# Patient Record
Sex: Female | Born: 1985 | Race: Black or African American | Hispanic: No | Marital: Married | Smoking: Former smoker
Health system: Southern US, Community
[De-identification: ages and names within clinical notes are randomized; demographics above are authoritative.]

## PROBLEM LIST (undated history)

## (undated) DIAGNOSIS — D649 Anemia, unspecified: Secondary | ICD-10-CM

## (undated) HISTORY — PX: NO PAST SURGERIES: SHX2092

## (undated) HISTORY — DX: Anemia, unspecified: D64.9

---
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
## 2006-07-15 DIAGNOSIS — J309 Allergic rhinitis, unspecified: Secondary | ICD-10-CM | POA: Insufficient documentation

## 2007-03-17 ENCOUNTER — Ambulatory Visit: Payer: Self-pay | Admitting: Family Medicine

## 2007-03-17 ENCOUNTER — Encounter: Payer: Self-pay | Admitting: Family Medicine

## 2007-03-17 ENCOUNTER — Encounter (INDEPENDENT_AMBULATORY_CARE_PROVIDER_SITE_OTHER): Payer: Self-pay | Admitting: Family Medicine

## 2007-03-17 DIAGNOSIS — R5381 Other malaise: Secondary | ICD-10-CM | POA: Insufficient documentation

## 2007-03-17 DIAGNOSIS — L738 Other specified follicular disorders: Secondary | ICD-10-CM | POA: Insufficient documentation

## 2007-03-17 DIAGNOSIS — L83 Acanthosis nigricans: Secondary | ICD-10-CM | POA: Insufficient documentation

## 2007-03-17 DIAGNOSIS — R5383 Other fatigue: Secondary | ICD-10-CM

## 2007-03-17 LAB — CONVERTED CEMR LAB
ALT: 12 units/L (ref 0–35)
AST: 18 units/L (ref 0–37)
Albumin: 4.2 g/dL (ref 3.5–5.2)
Alkaline Phosphatase: 98 units/L (ref 39–117)
BUN: 10 mg/dL (ref 6–23)
CO2: 24 meq/L (ref 19–32)
Calcium: 9.6 mg/dL (ref 8.4–10.5)
Chloride: 101 meq/L (ref 96–112)
Creatinine, Ser: 0.73 mg/dL (ref 0.40–1.20)
Glucose, Bld: 74 mg/dL (ref 70–99)
HCT: 35.4 % — ABNORMAL LOW (ref 36.0–46.0)
Hemoglobin: 12.4 g/dL (ref 12.0–15.0)
MCHC: 35 g/dL (ref 30.0–36.0)
MCV: 79.2 fL (ref 78.0–100.0)
Platelets: 290 10*3/uL (ref 150–400)
Potassium: 4.1 meq/L (ref 3.5–5.3)
RBC: 4.47 M/uL (ref 3.87–5.11)
RDW: 15.2 % — ABNORMAL HIGH (ref 11.5–14.0)
Sodium: 139 meq/L (ref 135–145)
TSH: 1.399 microintl units/mL (ref 0.350–5.50)
Total Bilirubin: 0.5 mg/dL (ref 0.3–1.2)
Total Protein: 7.9 g/dL (ref 6.0–8.3)
WBC: 7 10*3/uL (ref 4.0–10.5)

## 2007-07-20 ENCOUNTER — Telehealth: Payer: Self-pay | Admitting: *Deleted

## 2007-07-20 ENCOUNTER — Ambulatory Visit: Payer: Self-pay | Admitting: Family Medicine

## 2007-07-20 LAB — CONVERTED CEMR LAB: Beta hcg, urine, semiquantitative: NEGATIVE

## 2007-08-03 ENCOUNTER — Ambulatory Visit: Payer: Self-pay | Admitting: Family Medicine

## 2007-08-03 LAB — CONVERTED CEMR LAB: Beta hcg, urine, semiquantitative: NEGATIVE

## 2007-10-21 ENCOUNTER — Ambulatory Visit: Payer: Self-pay | Admitting: Family Medicine

## 2007-10-26 ENCOUNTER — Telehealth: Payer: Self-pay | Admitting: *Deleted

## 2007-11-07 ENCOUNTER — Telehealth: Payer: Self-pay | Admitting: *Deleted

## 2010-06-17 NOTE — Assessment & Plan Note (Signed)
Summary: ear pain, loss of hearing & swollen   Vital Signs:  Patient Profile:   25 Years Old Female Weight:      217 pounds Temp:     98.8 degrees F BP sitting:   119 / 70  Vitals Entered By: Jone Baseman CMA (July 20, 2007 11:43 AM)                 Chief Complaint:  left ear pain.  History of Present Illness: ear pain 4-5 days--maybe a little drainage from it. no fevers, no nasal congestion or cough  wants to start on depo provera--lmp was 3 weeks ago        Physical Exam  Ears:     left ear canal boggy red and some exudate. B TMs are normal in appearance.small posterior shotty nodes on left Neck:     supple, full ROM, and no masses.      Impression & Recommendations:  Problem # 1:  OTITIS EXTERNA, ACUTE, LEFT (ICD-380.22) Assessment: New  Orders: [facility]- Est Level  3 (04540)   Problem # 2:  CONTRACEPTIVE MANAGEMENT (ICD-V25.09) Assessment: New  Orders: U Preg-[facility] (81025) [facility]- Est Level  3 (99213) since 3 1/2 weeks lmp will check preg test today and have her rtc 2 weeks for repeat u preg and begin depo  Complete Medication List: 1)  Cortisporin 3.5-10000-1 Soln (Neomycin-polymyxin-hc) .... 2 gtt left ear qid for 1 week 2)  Ibuprofen 600 Mg Tabs (Ibuprofen) .Marland Kitchen.. 1 by mouth q 6 hrs as needed ear pain   Patient Instructions: 1)  Please schedule a follow-up appointment in 2 weeks. will repeat pregnancy test then and if negative start depo provera shots    Prescriptions: IBUPROFEN 600 MG  TABS (IBUPROFEN) 1 by mouth q 6 hrs as needed ear pain  #60 x 1   Entered and Authorized by:   Denny Levy MD   Signed by:   Denny Levy MD on 07/20/2007   Method used:   Print then Give to Patient   RxID:   [id number] CORTISPORIN 3.5-10000-1  SOLN (NEOMYCIN-POLYMYXIN-HC) 2 gtt left ear qid for 1 week  #1 bottle x 1   Entered and Authorized by:   Denny Levy MD   Signed by:   Denny Levy MD on 07/20/2007   Method used:   Print then Give to Patient   RxID:    [email or web address]  ] Laboratory Results   Urine Tests  Date/Time Received: July 20, 2007 12:08  PM  Date/Time Reported: July 20, 2007 12:13 PM     Urine HCG: negative Comments: ...............test performed by......Marland KitchenBonnie A. [place], [place] (ASCP)

## 2010-06-17 NOTE — Assessment & Plan Note (Signed)
Summary: UPREG/DEPO/OVERSTREET/[facility]  Nurse Visit   Complete Medication List: 1)  Cortisporin 3.5-10000-1 Soln (Neomycin-polymyxin-hc) .... 2 gtt left ear qid for 1 week 2)  Ibuprofen 600 Mg Tabs (Ibuprofen) .Marland Kitchen.. 1 by mouth q 6 hrs as needed ear pain    Prior Medications: CORTISPORIN 3.5-10000-1  SOLN (NEOMYCIN-POLYMYXIN-HC) 2 gtt left ear qid for 1 week IBUPROFEN 600 MG  TABS (IBUPROFEN) 1 by mouth q 6 hrs as needed ear pain  Laboratory Results   Urine Tests  Date/Time Received: August 03, 2007 2:01 PM  Date/Time Reported: August 03, 2007 2:06 PM     Urine HCG: negative Comments: ...................................................................DON[facility]  August 03, 2007 2:06 PM       Medication Administration  Injection # 1:    Medication: Depo-Provera 150mg     Diagnosis: CONTRACEPTIVE MANAGEMENT (ICD-V25.09)    Route: IM    Site: L deltoid    Exp Date: 11/2009    Lot #: [id number]    Mfr: pfizer    Comments: Pt instructed to return to clinic for next depo provera 10/19/07 - 11/02/07.    Patient tolerated injection without complications    Given by: AMY MARTIN RN (August 03, 2007 2:19 PM)  Orders Added: 1)  U Preg-[facility] [81025] 2)  Est Level 1- [facility] [06301] 3)  Depo-Provera 150mg  [J1055]    ]   Medication Administration  Injection # 1:    Medication: Depo-Provera 150mg     Diagnosis: CONTRACEPTIVE MANAGEMENT (ICD-V25.09)    Route: IM    Site: L deltoid    Exp Date: 11/2009    Lot #: [id number]    Mfr: pfizer    Comments: Pt instructed to return to clinic for next depo provera 10/19/07 - 11/02/07.    Patient tolerated injection without complications    Given by: AMY MARTIN RN (August 03, 2007 2:19 PM)  Orders Added: 1)  U Preg-[facility] [81025] 2)  Est Level 1- [facility] [23557] 3)  Depo-Provera 150mg  [J1055]

## 2010-06-17 NOTE — Progress Notes (Signed)
Summary: Depo  Phone Note Call from Patient Call back at [facility] Phone [phone]   Summary of Call: Is requesting to discuss depo. Initial call taken by: Haydee Salter,  November 07, 2007 10:10 AM  Follow-up for Phone Call        c/o light bleeding every day. this is her 2nd depo shot. explained that some women take 2-3 shots before menses even out or disappear. she was ok with answer Follow-up by: Golden Circle RN,  November 07, 2007 2:28 PM

## 2010-06-17 NOTE — Assessment & Plan Note (Signed)
Summary: np/cpp/pap/mom Kelly Ware is pt of Kelly Ware/ el   Vital Signs:  Patient Profile:   25 Years Old Female Weight:      212 pounds Pulse rate:   82 / minute BP sitting:   118 / 81  Vitals Entered By: Lillia Pauls CMA (March 17, 2007 2:16 PM)                 Chief Complaint:  NP FOR CPP.  History of Present Illness: Pt is a 25YOF here for her annual physical with pap.  She has 3 concerns:  1.  PNA--had "walking" PNA 1 month ago, diagnosed at an outside clinic when she was out of town.  REceived a course of antibiotics and improved.  She wants to make sure that her lungs sound OK.  2.  2 "bumps" on her groin--first noticed them several months ago.  They come and go, but often occur in conjunction with her period.  THey are itchy, but not painful.  They usually last about a week before going away.  No other rash.  Not sexually active.  Does shave her groin.    3.  fatigue--She feels "tired a lot"  SHe was diagnosed with anemia in 2004 was given a 22-month prescription of iron tablets and felt better when she took them.  Has felt tired since she ran out.  Was also told she was anemic when she was diagnosed with PNA.     Past Medical History:    told she was anemic in the past   Family History:    DM--grandmother  Social History:     Pt. Plays basketball    Pt going back to school in Jan/08    Works at [employer] demanding job    Smokes "blacks"--about 3 per week    drinks ocassionally--about 1 X month    no illicit drugs    no sexual activity    single    lives with mom and younger sister    Review of Systems  General      Complains of fatigue.      Denies weight loss.  Resp      Denies cough and sputum productive.  GU      Denies abnormal vaginal bleeding, hematuria, urinary frequency, and urinary hesitancy.      see hpi;  does have heavy periods   Physical Exam  General:     Well-developed,well-nourished,in no acute distress;  alert,appropriate and cooperative throughout examination Head:     Normocephalic and atraumatic without obvious abnormalities. No apparent alopecia or balding. Eyes:     No corneal or conjunctival inflammation noted. EOMI. Perrla.  Vision grossly normal. Mouth:     Oral mucosa and oropharynx without lesions or exudates.  Teeth in good repair. Neck:     No deformities, masses, or tenderness noted. Breasts:     No mass, nodules, thickening, tenderness, bulging, retraction, inflamation, nipple discharge or skin changes noted.   Lungs:     Normal respiratory effort, chest expands symmetrically. Lungs are clear to auscultation, no crackles or wheezes. Heart:     Normal rate and regular rhythm. S1 and S2 normal without gallop, murmur, click, rub or other extra sounds. Abdomen:     Bowel sounds positive,abdomen soft and non-tender without masses, organomegaly or hernias noted. Genitalia:     2 visible areas of folliculitis and one small palpable lump on labia majora.  Normal introitus for age, no vaginal discharge, mucosa pink and moist, no  vaginal or cervical lesions, no vaginal atrophy, no friaility or hemorrhage, normal uterus size positioned slightly laterally, no adnexal masses or tenderness Neurologic:     No cranial nerve deficits noted. Station and gait are normal.  Cervical Nodes:     No lymphadenopathy noted Axillary Nodes:     No palpable lymphadenopathy Psych:     Cognition and judgment appear intact. Alert and cooperative with normal attention span and concentration. No apparent delusions, illusions, hallucinations    Impression & Recommendations:  Problem # 1:  FATIGUE (ICD-780.79) Assessment: New History of anemia.  No hematuria, melena, or BRBPR.   Will check labs as below.  Told pt I would call her if any abnormalities were found.  Orders: CBC-[facility] (16109) TSH-[facility] [phone]) Comp Met-[facility] [phone]) Sed Rate (ESR)-[facility] [phone]) [facility]- Est Level  3  (57846)   Problem # 2:  ACANTHOSIS NIGRICANS (ICD-701.2) Assessment: New Found on exam.  Will check a BMET.  Does have family hx of DM (grandmother).  Pt does say she has some polyuria and fatigue, but no polydipsia or wt loss.  Has never been told that she has a high BG in the past. Orders: [facility]- Est Level  3 (96295)   Problem # 3:  Preventive Health Care (ICD-V70.0) Assessment: Unchanged  Pap smear done.  Counseled pt about gardisil vaccine.  She would like to receive it.  Would also like flu vaccine.   Problem # 4:  FOLLICULITIS (ICD-704.8) Assessment: New Will probably resolve on its own.  Told pt she could use OTC neosporin.  Also not shaving in that area may help.  Other Orders: Pap Smear- [facility] (Pap) HPV Vaccine - 3 sched doses - IM (28413) Admin 1st Vaccine (24401) Flu Vaccine & Administration [phone]) Flu Vaccine & Administration [phone])   Patient Instructions: 1)  Please schedule a follow-up appointment in 1 year. 2)  If any of your labs are abnormal, we will call you.    ]  HPV # 1    Vaccine Type: Gardasil    Site: right deltoid    Mfr: Merck    Dose: 0.5 ml    Route: IM    Given by: Lillia Pauls CMA    Exp. Date: 11/19/2008    Lot #: [id number]    VIS given: 06/19/05 version given March 17, 2007.  Flu Vaccine Consent Questions     Do you have a history of severe allergic reactions to this vaccine? no    Any prior history of allergic reactions to egg and/or gelatin? no    Do you have a sensitivity to the preservative Thimersol? no    Do you have a past history of Guillan-Barre Syndrome? no    Do you currently have an acute febrile illness? no    Have you ever had a severe reaction to latex? no    Vaccine information given and explained to patient? yes    Are you currently pregnant? no  Lot Number:[id number] Site Given l Deltoid  Laboratory Results   Blood Tests   Date/Time Received: March 17, 2007 3.51  PM  Date/Time Reported: March 17, 2007 5:16  PM   SED rate: 25 mm/hr  Comments: ...............test performed by......Marland KitchenBonnie A. [place], [place] (ASCP)

## 2010-06-17 NOTE — Assessment & Plan Note (Signed)
Summary: DEPO/KH  Nurse Visit    Prior Medications: CORTISPORIN 3.5-10000-1  SOLN (NEOMYCIN-POLYMYXIN-HC) 2 gtt left ear qid for 1 week IBUPROFEN 600 MG  TABS (IBUPROFEN) 1 by mouth q 6 hrs as needed ear pain     Medication Administration  Injection # 1:    Medication: Depo-Provera 150mg     Diagnosis: CONTRACEPTIVE MANAGEMENT (ICD-V25.09)    Route: IM    Site: L deltoid    Exp Date: 01/16/2010    Lot #: [id number]    Mfr: Pfizer    Comments: Next Depo Due: August 21 - September 4    Patient tolerated injection without complications    Given by: Garen Grams LPN (October 20, 452 12:08 PM)  Orders Added: 1)  Depo-Provera 150mg  [J1055] 2)  Est Level 1- [facility] [09811]    ]

## 2010-06-17 NOTE — Progress Notes (Signed)
Summary: wi request  Phone Note Call from Patient Call back at Home Phone [phone]   Reason for Call: Talk to Nurse Summary of Call: pt is requesting wi appt, sts she has pain in her ear and cold symptoms. Initial call taken by: ERIN LEVAN,  July 20, 2007 9:34 AM  Follow-up for Phone Call        had a cold. woke up with ear painful, swollen & can't hear. work in at [employer]. wait time explained Follow-up by: Golden Circle RN,  July 20, 2007 9:35 AM

## 2011-01-21 ENCOUNTER — Emergency Department (HOSPITAL_COMMUNITY)
Admission: EM | Admit: 2011-01-21 | Discharge: 2011-01-21 | Disposition: A | Discharge status: Home / Self Care | Payer: Self-pay | Attending: Emergency Medicine | Admitting: Emergency Medicine

## 2011-01-21 ENCOUNTER — Emergency Department (HOSPITAL_COMMUNITY): Payer: Self-pay

## 2011-01-21 DIAGNOSIS — R059 Cough, unspecified: Secondary | ICD-10-CM | POA: Insufficient documentation

## 2011-01-21 DIAGNOSIS — Z8701 Personal history of pneumonia (recurrent): Secondary | ICD-10-CM | POA: Insufficient documentation

## 2011-01-21 DIAGNOSIS — R509 Fever, unspecified: Secondary | ICD-10-CM | POA: Insufficient documentation

## 2011-01-21 DIAGNOSIS — R05 Cough: Secondary | ICD-10-CM | POA: Insufficient documentation

## 2011-01-21 DIAGNOSIS — R0602 Shortness of breath: Secondary | ICD-10-CM | POA: Insufficient documentation

## 2011-01-21 DIAGNOSIS — R079 Chest pain, unspecified: Secondary | ICD-10-CM | POA: Insufficient documentation

## 2011-01-21 DIAGNOSIS — F17200 Nicotine dependence, unspecified, uncomplicated: Secondary | ICD-10-CM | POA: Insufficient documentation

## 2011-01-21 DIAGNOSIS — B9789 Other viral agents as the cause of diseases classified elsewhere: Secondary | ICD-10-CM | POA: Insufficient documentation

## 2011-03-12 ENCOUNTER — Encounter: Payer: Self-pay | Admitting: Family Medicine

## 2011-03-12 ENCOUNTER — Ambulatory Visit (INDEPENDENT_AMBULATORY_CARE_PROVIDER_SITE_OTHER): Payer: BC Managed Care – PPO | Admitting: Family Medicine

## 2011-03-12 DIAGNOSIS — Z23 Encounter for immunization: Secondary | ICD-10-CM

## 2011-03-12 DIAGNOSIS — S39012A Strain of muscle, fascia and tendon of lower back, initial encounter: Secondary | ICD-10-CM | POA: Insufficient documentation

## 2011-03-12 DIAGNOSIS — S335XXA Sprain of ligaments of lumbar spine, initial encounter: Secondary | ICD-10-CM

## 2011-03-12 MED ORDER — MELOXICAM 15 MG PO TABS: 15.0000 mg | ORAL_TABLET | Freq: Every day | ORAL | Status: AC

## 2011-03-12 NOTE — Patient Instructions (Addendum)
Meloxicam- medicine for back pain  Also use tylenol with it but no other NSAIDS (ibuprofen, aleve)  See back exercises to help keep you strong and prevent you from pulling it.  Can consider physical therapy if you don't seem to be making progress.  Make follow-up for you annual physical  Back Pain, Adult Low back pain is very common. About 1 in 5 people have back pain. The cause of low back pain is rarely dangerous. The pain often gets better over time. About half of people with a sudden onset of back pain feel better in just 2 weeks. About 8 in 10 people feel better by 6 weeks.   CAUSES Some common causes of back pain include:  Strain of the muscles or ligaments supporting the spine.     Wear and tear (degeneration) of the spinal discs.     Arthritis.    Direct injury to the back.  DIAGNOSIS Most of the time, the direct cause of low back pain is not known. However, back pain can be treated effectively even when the exact cause of the pain is unknown. Answering your caregiver's questions about your overall health and symptoms is one of the most accurate ways to make sure the cause of your pain is not dangerous. If your caregiver needs more information, he or she may order lab work or imaging tests (X-rays or MRIs). However, even if imaging tests show changes in your back, this usually does not require surgery. HOME CARE INSTRUCTIONS For many people, back pain returns. Since low back pain is rarely dangerous, it is often a condition that people can learn to manage on their own.    Remain active. It is stressful on the back to sit or stand in one place. Do not sit, drive, or stand in one place for more than 30 minutes at a time. Take short walks on level surfaces as soon as pain allows. Try to increase the length of time you walk each day.     Do not stay in bed. Resting more than 1 or 2 days can delay your recovery.     Do not avoid exercise or work. Your body is made to move. It is not  dangerous to be active, even though your back may hurt. Your back will likely heal faster if you return to being active before your pain is gone.     Pay attention to your body when you  bend and lift. Many people have less discomfort when lifting if they bend their knees, keep the load close to their bodies, and avoid twisting. Often, the most comfortable positions are those that put less stress on your recovering back.     Find a comfortable position to sleep. Use a firm mattress and lie on your side with your knees slightly bent. If you lie on your back, put a pillow under your knees.     Only take over-the-counter or prescription medicines as directed by your caregiver. Over-the-counter medicines to reduce pain and inflammation are often the most helpful. Your caregiver may prescribe muscle relaxant drugs. These medicines help dull your pain so you can more quickly return to your normal activities and healthy exercise.     Put ice on the injured area.     Put ice in a plastic bag.     Place a towel between your skin and the bag.     Leave the ice on for 15 to 20 minutes, 3 to 4 times a  day for the first 2 to 3 days. After that, ice and heat may be alternated to reduce pain and spasms.     Ask your caregiver about trying back exercises and gentle massage. This may be of some benefit.     Avoid feeling anxious or stressed. Stress increases muscle tension and can worsen back pain. It is important to recognize when you are anxious or stressed and learn ways to manage it. Exercise is a great option.  SEEK MEDICAL CARE IF:  You have pain that is not relieved with rest or medicine.     You have pain that does not improve in 1 week.     You have new symptoms.     You are generally not feeling well.  SEEK IMMEDIATE MEDICAL CARE IF:    You have pain that radiates from your back into your legs.     You develop new bowel or bladder control problems.     You have unusual weakness or numbness  in your arms or legs.     You develop nausea or vomiting.     You develop abdominal pain.     You feel faint.  Document Released: 05/04/2005 Document Revised: 01/14/2011 Document Reviewed: 09/22/2010 [facility] Patient Information 2012 [place], [place].

## 2011-03-12 NOTE — Progress Notes (Signed)
  Subjective:    Patient ID: Kelly Ware, female    DOB: 1986/01/05, 25 y.o.   MRN: [id number]  HPIBack pain:  2-3 months of low back pain since she had a sharp pain when bending over.  Resolved and for the past 2 weeks has been hurting again.  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  Started as intermittent pain.  Takes ibuprofen 2 tabs "every so often"  States pain is not every day.  No radiation.    Stiff when waking up, hurts more after a long day.  Plays basketball occaisionally. Review of Systems No numbness, tingling, weakness, change in urine or bladder pain.  No fevers, chills.    Objective:   Physical Exam  GEN: Alert & Oriented, No acute distress CV:  Regular Rate & Rhythm, no murmur Respiratory:  Normal work of breathing, CTAB Abd:  + BS, soft, no tenderness to palpation Ext: no pre-tibial edema Back:  Mild TTP over lumbar paraspinal muscles.  Neg straight leg raise.  Patellar reflexes 2+ bilaterally.        Assessment & Plan:

## 2011-03-12 NOTE — Assessment & Plan Note (Signed)
Acute, not chronic.  Will treat with tylenol and meloxicam.    Gave info on supportive care and back exercises.  Advised if does not improve, let me know and we will schedule physical therapy.

## 2011-03-17 ENCOUNTER — Encounter: Payer: BC Managed Care – PPO | Admitting: Family Medicine

## 2011-03-19 ENCOUNTER — Encounter: Payer: BC Managed Care – PPO | Admitting: Family Medicine

## 2012-08-01 ENCOUNTER — Emergency Department (HOSPITAL_COMMUNITY)
Admission: EM | Admit: 2012-08-01 | Discharge: 2012-08-02 | Disposition: A | Discharge status: Home / Self Care | Payer: BC Managed Care – PPO | Attending: Emergency Medicine | Admitting: Emergency Medicine

## 2012-08-01 ENCOUNTER — Encounter (HOSPITAL_COMMUNITY): Payer: Self-pay | Admitting: *Deleted

## 2012-08-01 DIAGNOSIS — IMO0002 Reserved for concepts with insufficient information to code with codable children: Secondary | ICD-10-CM | POA: Insufficient documentation

## 2012-08-01 DIAGNOSIS — F17200 Nicotine dependence, unspecified, uncomplicated: Secondary | ICD-10-CM | POA: Insufficient documentation

## 2012-08-01 DIAGNOSIS — J45909 Unspecified asthma, uncomplicated: Secondary | ICD-10-CM | POA: Insufficient documentation

## 2012-08-01 DIAGNOSIS — T148XXA Other injury of unspecified body region, initial encounter: Secondary | ICD-10-CM

## 2012-08-01 DIAGNOSIS — Z862 Personal history of diseases of the blood and blood-forming organs and certain disorders involving the immune mechanism: Secondary | ICD-10-CM | POA: Insufficient documentation

## 2012-08-01 DIAGNOSIS — Y92410 Unspecified street and highway as the place of occurrence of the external cause: Secondary | ICD-10-CM | POA: Insufficient documentation

## 2012-08-01 DIAGNOSIS — S8990XA Unspecified injury of unspecified lower leg, initial encounter: Secondary | ICD-10-CM | POA: Insufficient documentation

## 2012-08-01 DIAGNOSIS — Y9389 Activity, other specified: Secondary | ICD-10-CM | POA: Insufficient documentation

## 2012-08-01 MED ORDER — HYDROCODONE-ACETAMINOPHEN 5-325 MG PO TABS
1.0000 | ORAL_TABLET | Freq: Once | ORAL | Status: AC
  Administered 2012-08-01: 1 via ORAL
  Filled 2012-08-01: qty 1

## 2012-08-01 MED ORDER — IBUPROFEN 200 MG PO TABS
600.0000 mg | ORAL_TABLET | Freq: Once | ORAL | Status: AC
  Administered 2012-08-01: 600 mg via ORAL
  Filled 2012-08-01: qty 1
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
## 2012-08-02 MED ORDER — IBUPROFEN 600 MG PO TABS: 600.0000 mg | ORAL_TABLET | Freq: Three times a day (TID) | ORAL | Status: DC | PRN

## 2013-08-03 ENCOUNTER — Emergency Department (HOSPITAL_COMMUNITY)
Admission: EM | Admit: 2013-08-03 | Discharge: 2013-08-03 | Disposition: A | Discharge status: Home / Self Care | Payer: No Typology Code available for payment source | Attending: Emergency Medicine | Admitting: Emergency Medicine

## 2013-08-03 ENCOUNTER — Encounter (HOSPITAL_COMMUNITY): Payer: Self-pay | Admitting: Emergency Medicine

## 2013-08-03 ENCOUNTER — Emergency Department (HOSPITAL_COMMUNITY): Payer: No Typology Code available for payment source

## 2013-08-03 DIAGNOSIS — S199XXA Unspecified injury of neck, initial encounter: Secondary | ICD-10-CM | POA: Diagnosis present

## 2013-08-03 DIAGNOSIS — Z862 Personal history of diseases of the blood and blood-forming organs and certain disorders involving the immune mechanism: Secondary | ICD-10-CM | POA: Insufficient documentation

## 2013-08-03 DIAGNOSIS — F17200 Nicotine dependence, unspecified, uncomplicated: Secondary | ICD-10-CM | POA: Insufficient documentation

## 2013-08-03 DIAGNOSIS — S46909A Unspecified injury of unspecified muscle, fascia and tendon at shoulder and upper arm level, unspecified arm, initial encounter: Secondary | ICD-10-CM | POA: Insufficient documentation

## 2013-08-03 DIAGNOSIS — IMO0002 Reserved for concepts with insufficient information to code with codable children: Secondary | ICD-10-CM | POA: Insufficient documentation

## 2013-08-03 DIAGNOSIS — M62838 Other muscle spasm: Secondary | ICD-10-CM | POA: Diagnosis not present

## 2013-08-03 DIAGNOSIS — Y9389 Activity, other specified: Secondary | ICD-10-CM | POA: Diagnosis not present

## 2013-08-03 DIAGNOSIS — S8990XA Unspecified injury of unspecified lower leg, initial encounter: Secondary | ICD-10-CM | POA: Diagnosis not present

## 2013-08-03 DIAGNOSIS — Z79899 Other long term (current) drug therapy: Secondary | ICD-10-CM | POA: Diagnosis not present

## 2013-08-03 DIAGNOSIS — S99919A Unspecified injury of unspecified ankle, initial encounter: Secondary | ICD-10-CM | POA: Diagnosis not present

## 2013-08-03 DIAGNOSIS — S0990XA Unspecified injury of head, initial encounter: Secondary | ICD-10-CM | POA: Diagnosis not present

## 2013-08-03 DIAGNOSIS — S4980XA Other specified injuries of shoulder and upper arm, unspecified arm, initial encounter: Secondary | ICD-10-CM | POA: Insufficient documentation

## 2013-08-03 DIAGNOSIS — Z791 Long term (current) use of non-steroidal anti-inflammatories (NSAID): Secondary | ICD-10-CM | POA: Diagnosis not present

## 2013-08-03 DIAGNOSIS — S0993XA Unspecified injury of face, initial encounter: Secondary | ICD-10-CM | POA: Insufficient documentation

## 2013-08-03 DIAGNOSIS — M542 Cervicalgia: Secondary | ICD-10-CM

## 2013-08-03 DIAGNOSIS — Y92410 Unspecified street and highway as the place of occurrence of the external cause: Secondary | ICD-10-CM | POA: Diagnosis not present

## 2013-08-03 DIAGNOSIS — S99929A Unspecified injury of unspecified foot, initial encounter: Secondary | ICD-10-CM

## 2013-08-03 MED ORDER — HYDROCODONE-ACETAMINOPHEN 5-325 MG PO TABS: 1.0000 | ORAL_TABLET | ORAL | Status: DC | PRN

## 2013-08-03 MED ORDER — NAPROXEN 500 MG PO TABS: 500.0000 mg | ORAL_TABLET | Freq: Two times a day (BID) | ORAL | Status: DC

## 2013-08-03 MED ORDER — HYDROCODONE-ACETAMINOPHEN 5-325 MG PO TABS
2.0000 | ORAL_TABLET | Freq: Once | ORAL | Status: AC
  Administered 2013-08-03: 2 via ORAL
  Filled 2013-08-03: qty 2

## 2013-08-03 MED ORDER — METHOCARBAMOL 500 MG PO TABS: 500.0000 mg | ORAL_TABLET | Freq: Two times a day (BID) | ORAL | Status: DC
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
## 2013-08-03 NOTE — ED Notes (Signed)
Pt log rolled and removed from the spinal board with the assistance of this RN, Nicki and Starwood HotelsErica.  Pt c/o of neck tenderness, c-collar maintained and tenderness to the lumbar region.

## 2013-08-07 ENCOUNTER — Encounter (HOSPITAL_COMMUNITY): Payer: Self-pay | Admitting: Emergency Medicine

## 2013-08-07 ENCOUNTER — Emergency Department (HOSPITAL_COMMUNITY)
Admission: EM | Admit: 2013-08-07 | Discharge: 2013-08-07 | Disposition: A | Discharge status: Home / Self Care | Payer: BC Managed Care – PPO | Attending: Emergency Medicine | Admitting: Emergency Medicine

## 2013-08-07 DIAGNOSIS — Z862 Personal history of diseases of the blood and blood-forming organs and certain disorders involving the immune mechanism: Secondary | ICD-10-CM | POA: Insufficient documentation

## 2013-08-07 DIAGNOSIS — G8911 Acute pain due to trauma: Secondary | ICD-10-CM | POA: Insufficient documentation

## 2013-08-07 DIAGNOSIS — M25519 Pain in unspecified shoulder: Secondary | ICD-10-CM | POA: Insufficient documentation

## 2013-08-07 DIAGNOSIS — M545 Low back pain, unspecified: Secondary | ICD-10-CM | POA: Insufficient documentation

## 2013-08-07 DIAGNOSIS — M62838 Other muscle spasm: Secondary | ICD-10-CM | POA: Insufficient documentation

## 2013-08-07 DIAGNOSIS — Z791 Long term (current) use of non-steroidal anti-inflammatories (NSAID): Secondary | ICD-10-CM | POA: Insufficient documentation

## 2013-08-07 DIAGNOSIS — M25512 Pain in left shoulder: Secondary | ICD-10-CM

## 2013-08-07 DIAGNOSIS — Z79899 Other long term (current) drug therapy: Secondary | ICD-10-CM | POA: Insufficient documentation

## 2013-08-07 DIAGNOSIS — R519 Headache, unspecified: Secondary | ICD-10-CM

## 2013-08-07 DIAGNOSIS — M62830 Muscle spasm of back: Secondary | ICD-10-CM

## 2013-08-07 DIAGNOSIS — F17200 Nicotine dependence, unspecified, uncomplicated: Secondary | ICD-10-CM | POA: Insufficient documentation

## 2013-08-07 DIAGNOSIS — R51 Headache: Secondary | ICD-10-CM | POA: Insufficient documentation

## 2013-08-07 DIAGNOSIS — M25569 Pain in unspecified knee: Secondary | ICD-10-CM | POA: Insufficient documentation

## 2013-08-07 DIAGNOSIS — M542 Cervicalgia: Secondary | ICD-10-CM | POA: Insufficient documentation

## 2013-08-07 DIAGNOSIS — H53149 Visual discomfort, unspecified: Secondary | ICD-10-CM | POA: Insufficient documentation
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
## 2013-08-07 NOTE — ED Provider Notes (Signed)
Medical screening examination/treatment/procedure(s) were performed by non-physician practitioner and as supervising physician I was immediately available for consultation/collaboration.   EKG Interpretation None       Valine Drozdowski R. Annaleigh Steinmeyer, MD 08/07/13 0730 

## 2013-08-07 NOTE — Discharge Instructions (Signed)
Continue to take pain medication as prescribed. You may find heat therapy and gentle stretching help relieve pain. See further instruction below.

## 2013-08-08 NOTE — ED Provider Notes (Signed)
Medical screening examination/treatment/procedure(s) were performed by non-physician practitioner and as supervising physician I was immediately available for consultation/collaboration.    Can Lucci L Admiral Marcucci, MD 08/08/13 2324 

## 2013-08-11 ENCOUNTER — Ambulatory Visit (INDEPENDENT_AMBULATORY_CARE_PROVIDER_SITE_OTHER): Payer: BC Managed Care – PPO | Admitting: Family Medicine

## 2013-08-11 ENCOUNTER — Encounter: Payer: Self-pay | Admitting: Family Medicine

## 2013-08-11 VITALS — BP 131/95 | HR 67 | Temp 98.0°F | Ht 70.0 in | Wt 252.3 lb

## 2013-08-11 DIAGNOSIS — IMO0002 Reserved for concepts with insufficient information to code with codable children: Secondary | ICD-10-CM

## 2013-08-11 DIAGNOSIS — S39012A Strain of muscle, fascia and tendon of lower back, initial encounter: Secondary | ICD-10-CM

## 2013-08-11 DIAGNOSIS — S46912A Strain of unspecified muscle, fascia and tendon at shoulder and upper arm level, left arm, initial encounter: Secondary | ICD-10-CM | POA: Insufficient documentation

## 2013-08-11 DIAGNOSIS — S335XXA Sprain of ligaments of lumbar spine, initial encounter: Secondary | ICD-10-CM

## 2013-08-11 MED ORDER — NAPROXEN 500 MG PO TABS
500.0000 mg | ORAL_TABLET | Freq: Two times a day (BID) | ORAL | Status: DC
Start: 2013-08-11 — End: 2013-08-11

## 2013-08-11 MED ORDER — NAPROXEN 500 MG PO TABS: 500.0000 mg | ORAL_TABLET | Freq: Two times a day (BID) | ORAL | Status: DC

## 2013-08-11 MED ORDER — TRAMADOL HCL 50 MG PO TABS: 50.0000 mg | ORAL_TABLET | Freq: Three times a day (TID) | ORAL | Status: DC | PRN

## 2013-08-11 NOTE — Patient Instructions (Signed)
Ms. Kelly Ware,  Thank you for coming in today. It was a pleasure meeting you. I believe you have continued low back strain and L shoulder strain with AC joint arthritis. For this please do the following:  1. Ice 20 minutes-low back, upper back. L shoulder. 2. Naproxen twice daily with food 3. Tramadol and robaxin as needed.  I have placed referral to PT.  My staff will contact you with PT appt.  Please come back to see me for f/u after PT. Also schedule wellness visit for physical with pap smear.   Dr. Armen PickupFunches

## 2013-08-14 NOTE — Assessment & Plan Note (Signed)
I believe you have continued low back strain and L shoulder strain with AC joint arthritis. For this please do the following:  1. Ice 20 minutes-low back, upper back. L shoulder. 2. Naproxen twice daily with food 3. Tramadol and robaxin as needed.  I have placed referral to PT.  My staff will contact you with PT appt. 
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
## 2013-08-14 NOTE — Assessment & Plan Note (Signed)
I believe you have continued low back strain and L shoulder strain with AC joint arthritis. For this please do the following:  1. Ice 20 minutes-low back, upper back. L shoulder. 2. Naproxen twice daily with food 3. Tramadol and robaxin as needed.  I have placed referral to PT.  My staff will contact you with PT appt.

## 2013-08-22 ENCOUNTER — Ambulatory Visit: Payer: BC Managed Care – PPO | Attending: Family Medicine | Admitting: Physical Therapy

## 2013-08-22 DIAGNOSIS — M25619 Stiffness of unspecified shoulder, not elsewhere classified: Secondary | ICD-10-CM | POA: Insufficient documentation

## 2013-08-22 DIAGNOSIS — M2550 Pain in unspecified joint: Secondary | ICD-10-CM | POA: Insufficient documentation

## 2013-08-22 DIAGNOSIS — IMO0001 Reserved for inherently not codable concepts without codable children: Secondary | ICD-10-CM | POA: Insufficient documentation

## 2013-08-24 ENCOUNTER — Ambulatory Visit: Payer: BC Managed Care – PPO | Admitting: Rehabilitation

## 2013-08-29 ENCOUNTER — Ambulatory Visit: Payer: BC Managed Care – PPO | Admitting: Rehabilitation

## 2013-08-31 ENCOUNTER — Ambulatory Visit: Payer: BC Managed Care – PPO | Admitting: Physical Therapy

## 2013-09-04 ENCOUNTER — Ambulatory Visit: Payer: BC Managed Care – PPO | Admitting: Physical Therapy

## 2013-09-06 ENCOUNTER — Encounter: Payer: BC Managed Care – PPO | Admitting: Physical Therapy

## 2014-03-21 ENCOUNTER — Ambulatory Visit: Payer: BC Managed Care – PPO

## 2014-08-02 ENCOUNTER — Emergency Department (HOSPITAL_COMMUNITY)
Admission: EM | Admit: 2014-08-02 | Discharge: 2014-08-02 | Disposition: A | Discharge status: Home / Self Care | Payer: Self-pay | Attending: Emergency Medicine | Admitting: Emergency Medicine

## 2014-08-02 ENCOUNTER — Other Ambulatory Visit: Payer: Self-pay

## 2014-08-02 ENCOUNTER — Emergency Department (HOSPITAL_COMMUNITY): Payer: Self-pay

## 2014-08-02 ENCOUNTER — Encounter (HOSPITAL_COMMUNITY): Payer: Self-pay

## 2014-08-02 DIAGNOSIS — Z862 Personal history of diseases of the blood and blood-forming organs and certain disorders involving the immune mechanism: Secondary | ICD-10-CM | POA: Insufficient documentation

## 2014-08-02 DIAGNOSIS — Z3202 Encounter for pregnancy test, result negative: Secondary | ICD-10-CM | POA: Insufficient documentation

## 2014-08-02 DIAGNOSIS — R0602 Shortness of breath: Secondary | ICD-10-CM

## 2014-08-02 DIAGNOSIS — Z87891 Personal history of nicotine dependence: Secondary | ICD-10-CM | POA: Insufficient documentation

## 2014-08-02 DIAGNOSIS — Z79899 Other long term (current) drug therapy: Secondary | ICD-10-CM | POA: Insufficient documentation

## 2014-08-02 DIAGNOSIS — B349 Viral infection, unspecified: Secondary | ICD-10-CM | POA: Insufficient documentation

## 2014-08-02 DIAGNOSIS — Z7982 Long term (current) use of aspirin: Secondary | ICD-10-CM | POA: Insufficient documentation

## 2014-08-02 LAB — URINALYSIS, ROUTINE W REFLEX MICROSCOPIC
Bilirubin Urine: NEGATIVE
Glucose, UA: NEGATIVE mg/dL
Hgb urine dipstick: NEGATIVE
Ketones, ur: NEGATIVE mg/dL
Leukocytes, UA: NEGATIVE
Nitrite: NEGATIVE
Protein, ur: NEGATIVE mg/dL
Specific Gravity, Urine: 1.014 (ref 1.005–1.030)
Urobilinogen, UA: 1 mg/dL (ref 0.0–1.0)
pH: 7.5 (ref 5.0–8.0)

## 2014-08-02 LAB — COMPREHENSIVE METABOLIC PANEL
ALT: 29 U/L (ref 0–35)
AST: 34 U/L (ref 0–37)
Albumin: 4.3 g/dL (ref 3.5–5.2)
Alkaline Phosphatase: 136 U/L — ABNORMAL HIGH (ref 39–117)
Anion gap: 10 (ref 5–15)
BUN: 6 mg/dL (ref 6–23)
CO2: 23 mmol/L (ref 19–32)
Calcium: 9.6 mg/dL (ref 8.4–10.5)
Chloride: 104 mmol/L (ref 96–112)
Creatinine, Ser: 0.78 mg/dL (ref 0.50–1.10)
GFR calc Af Amer: >90 mL/min (ref >90)
GFR calc non Af Amer: >90 mL/min (ref >90)
Glucose, Bld: 95 mg/dL (ref 70–99)
Potassium: 3.5 mmol/L (ref 3.5–5.1)
Sodium: 137 mmol/L (ref 135–145)
Total Bilirubin: 0.4 mg/dL (ref 0.3–1.2)
Total Protein: 8.7 g/dL — ABNORMAL HIGH (ref 6.0–8.3)

## 2014-08-02 LAB — CBC WITH DIFFERENTIAL/PLATELET
Basophils Absolute: 0 10*3/uL (ref 0.0–0.1)
Basophils Relative: 0 % (ref 0–1)
Eosinophils Absolute: 0.1 10*3/uL (ref 0.0–0.7)
Eosinophils Relative: 1 % (ref 0–5)
HCT: 36.6 % (ref 36.0–46.0)
Hemoglobin: 12.6 g/dL (ref 12.0–15.0)
Lymphocytes Relative: 12 % (ref 12–46)
Lymphs Abs: 1.1 10*3/uL (ref 0.7–4.0)
MCH: 26.9 pg (ref 26.0–34.0)
MCHC: 34.4 g/dL (ref 30.0–36.0)
MCV: 78 fL (ref 78.0–100.0)
Monocytes Absolute: 0.5 10*3/uL (ref 0.1–1.0)
Monocytes Relative: 6 % (ref 3–12)
Neutro Abs: 7.2 10*3/uL (ref 1.7–7.7)
Neutrophils Relative %: 81 % — ABNORMAL HIGH (ref 43–77)
Platelets: 287 10*3/uL (ref 150–400)
RBC: 4.69 MIL/uL (ref 3.87–5.11)
RDW: 15.8 % — ABNORMAL HIGH (ref 11.5–15.5)
WBC: 8.9 10*3/uL (ref 4.0–10.5)

## 2014-08-02 LAB — POC URINE PREG, ED: Preg Test, Ur: NEGATIVE

## 2014-08-02 LAB — LIPASE, BLOOD: Lipase: 24 U/L (ref 11–59)

## 2014-08-02 MED ORDER — OSELTAMIVIR PHOSPHATE 75 MG PO CAPS: 75.0000 mg | ORAL_CAPSULE | Freq: Two times a day (BID) | ORAL | Status: DC

## 2014-08-02 MED ORDER — ALBUTEROL SULFATE HFA 108 (90 BASE) MCG/ACT IN AERS
2.0000 | INHALATION_SPRAY | RESPIRATORY_TRACT | Status: DC
  Filled 2014-08-02: qty 6.7

## 2014-08-02 NOTE — Discharge Instructions (Signed)
Cough, Adult ° A cough is a reflex that helps clear your throat and airways. It can help heal the body or may be a reaction to an irritated airway. A cough may only last 2 or 3 weeks (acute) or may last more than 8 weeks (chronic).  °CAUSES °Acute cough: °· Viral or bacterial infections. °Chronic cough: °· Infections. °· Allergies. °· Asthma. °· Post-nasal drip. °· Smoking. °· Heartburn or acid reflux. °· Some medicines. °· Chronic lung problems (COPD). °· Cancer. °SYMPTOMS  °· Cough. °· Fever. °· Chest pain. °· Increased breathing rate. °· High-pitched whistling sound when breathing (wheezing). °· Colored mucus that you cough up (sputum). °TREATMENT  °· A bacterial cough may be treated with antibiotic medicine. °· A viral cough must run its course and will not respond to antibiotics. °· Your caregiver may recommend other treatments if you have a chronic cough. °HOME CARE INSTRUCTIONS  °· Only take over-the-counter or prescription medicines for pain, discomfort, or fever as directed by your caregiver. Use cough suppressants only as directed by your caregiver. °· Use a cold steam vaporizer or humidifier in your bedroom or home to help loosen secretions. °· Sleep in a semi-upright position if your cough is worse at night. °· Rest as needed. °· Stop smoking if you smoke. °SEEK IMMEDIATE MEDICAL CARE IF:  °· You have pus in your sputum. °· Your cough starts to worsen. °· You cannot control your cough with suppressants and are losing sleep. °· You begin coughing up blood. °· You have difficulty breathing. °· You develop pain which is getting worse or is uncontrolled with medicine. °· You have a fever. °MAKE SURE YOU:  °· Understand these instructions. °· Will watch your condition. °· Will get help right away if you are not doing well or get worse. °Document Released: 10/31/2010 Document Revised: 07/27/2011 Document Reviewed: 10/31/2010 °ExitCare® Patient Information ©2015 [employer]. This information is not intended  to replace advice given to you by your health care provider. Make sure you discuss any questions you have with your health care provider. °Influenza °Influenza ("the flu") is a viral infection of the respiratory tract. It occurs more often in winter months because people spend more time in close contact with one another. Influenza can make you feel very sick. Influenza easily spreads from person to person (contagious). °CAUSES  °Influenza is caused by a virus that infects the respiratory tract. You can catch the virus by breathing in droplets from an infected person's cough or sneeze. You can also catch the virus by touching something that was recently contaminated with the virus and then touching your mouth, nose, or eyes. °RISKS AND COMPLICATIONS °You may be at risk for a more severe case of influenza if you smoke cigarettes, have diabetes, have chronic heart disease (such as heart failure) or lung disease (such as asthma), or if you have a weakened immune system. Elderly people and pregnant women are also at risk for more serious infections. The most common problem of influenza is a lung infection (pneumonia). Sometimes, this problem can require emergency medical care and may be life threatening. °SIGNS AND SYMPTOMS  °Symptoms typically last 4 to 10 days and may include: °· Fever. °· Chills. °· Headache, body aches, and muscle aches. °· Sore throat. °· Chest discomfort and cough. °· Poor appetite. °· Weakness or feeling tired. °· Dizziness. °· Nausea or vomiting. °DIAGNOSIS  °Diagnosis of influenza is often made based on your history and a physical exam. A nose or throat   swab test can be done to confirm the diagnosis. °TREATMENT  °In mild cases, influenza goes away on its own. Treatment is directed at relieving symptoms. For more severe cases, your health care provider may prescribe antiviral medicines to shorten the sickness. Antibiotic medicines are not effective because the infection is caused by a virus, not  by bacteria. °HOME CARE INSTRUCTIONS °· Take medicines only as directed by your health care provider. °· Use a cool mist humidifier to make breathing easier. °· Get plenty of rest until your temperature returns to normal. This usually takes 3 to 4 days. °· Drink enough fluid to keep your urine clear or pale yellow. °· Cover your mouth and nose when coughing or sneezing, and wash your hands well to prevent the virus from spreading. °· Stay home from work or school until the fever is gone for at least 1 full day. °PREVENTION  °An annual influenza vaccination (flu shot) is the best way to avoid getting influenza. An annual flu shot is now routinely recommended for all adults in the [place] °SEEK MEDICAL CARE IF: °· You experience chest pain, your cough worsens, or you produce more mucus. °· You have nausea, vomiting, or diarrhea. °· Your fever returns or gets worse. °SEEK IMMEDIATE MEDICAL CARE IF: °· You have trouble breathing, you become short of breath, or your skin or nails become bluish. °· You have severe pain or stiffness in the neck. °· You develop a sudden headache, or pain in the face or ear. °· You have nausea or vomiting that you cannot control. °MAKE SURE YOU:  °· Understand these instructions. °· Will watch your condition. °· Will get help right away if you are not doing well or get worse. °Document Released: 05/01/2000 Document Revised: 09/18/2013 Document Reviewed: 08/03/2011 °ExitCare® Patient Information ©2015 [employer]. This information is not intended to replace advice given to you by your health care provider. Make sure you discuss any questions you have with your health care provider. ° °

## 2014-08-02 NOTE — ED Notes (Signed)
Pt came in today w/ c/o of nausea, emesis, cough, SOB, generalized pain.  Pt reports that symptoms started yesterday around 1400. Pt reports that it started w/ cough and w/i an hour she had nausea, aching and chills.  Pt reports night sweats.  Pt estimates she's emesis x4, the last occurrence at 0600 07/27/14.  Pt reports not being able to eat/drink.  Pt reports not taking anything to relieve symptoms.  Pt states coughing up "chunky, beige stuff".  Pt reports it feels like when she had pneumonia.

## 2014-08-02 NOTE — ED Provider Notes (Signed)
CSN: [id number]     Arrival date & time 08/02/14  1313 History   None    Chief Complaint  Patient presents with  . Shortness of Breath  . Emesis     (Consider location/radiation/quality/duration/timing/severity/associated sxs/prior Treatment) Patient is a 29 y.o. female presenting with cough. The history is provided by the patient. No language interpreter was used.  Cough Cough characteristics:  Productive Sputum characteristics:  Green Severity:  Moderate Onset quality:  Gradual Timing:  Constant Progression:  Worsening Chronicity:  New Smoker: no   Context: not sick contacts   Relieved by:  Nothing Worsened by:  Nothing tried Ineffective treatments:  None tried Associated symptoms: fever and sinus congestion   Pt also complains of a sore area to her left 4th toe  Past Medical History  Diagnosis Date  . Anemia    History reviewed. No pertinent past surgical history. Family History  Problem Relation Age of Onset  . Heart disease Maternal Grandfather    History  Substance Use Topics  . Smoking status: Former Smoker    Types: Cigarettes    Quit date: 05/18/2013  . Smokeless tobacco: Not on file     Comment: smokes some times  . Alcohol Use: No   OB History    No data available     Review of Systems  Constitutional: Positive for fever.  Respiratory: Positive for cough.   All other systems reviewed and are negative.     Allergies  Review of patient's allergies indicates no known allergies.  Home Medications   Prior to Admission medications   Medication Sig Start Date End Date Taking? Authorizing Provider  aspirin 325 MG EC tablet Take 325 mg by mouth as needed (headache.).   Yes Historical Provider, MD  methocarbamol (ROBAXIN) 500 MG tablet Take 1 tablet (500 mg total) by mouth 2 (two) times daily. Patient not taking: Reported on 08/02/2014 08/03/13   Cristobal Goldmann, PA-C  naproxen (NAPROSYN) 500 MG tablet Take 1 tablet (500 mg total) by mouth 2 (two) times  daily. Patient not taking: Reported on 08/02/2014 08/11/13   Dessa Phi, MD  traMADol (ULTRAM) 50 MG tablet Take 1 tablet (50 mg total) by mouth every 8 (eight) hours as needed for severe pain. Patient not taking: Reported on 08/02/2014 08/11/13   Josalyn Funches, MD   BP 142/70 mmHg  Pulse 110  Temp(Src) 99.2 F (37.3 C) (Oral)  Resp 18  Ht  (1.778 m)  Wt 250 lb (113.399 kg)  BMI 35.87 kg/m2  SpO2 95%  LMP 07/26/2014 Physical Exam  Constitutional: She is oriented to person, place, and time. She appears well-developed and well-nourished.  HENT:  Head: Normocephalic.  Right Ear: External ear normal.  Left Ear: External ear normal.  Mouth/Throat: Oropharynx is clear and moist.  Tender maxillary snuses  Eyes: Conjunctivae and EOM are normal. Pupils are equal, round, and reactive to light.  Neck: Normal range of motion.  Cardiovascular: Normal rate.   Pulmonary/Chest: Effort normal.  Abdominal: Soft. She exhibits no distension.  Musculoskeletal: Normal range of motion.  Neurological: She is alert and oriented to person, place, and time.  Skin: There is erythema.  Small swollen area 4th toe,  Looks like a wart  Psychiatric: She has a normal mood and affect.  Nursing note and vitals reviewed.   ED Course  Procedures (including critical care time) Labs Review Labs Reviewed  CBC WITH DIFFERENTIAL/PLATELET - Abnormal; Notable for the following:    RDW 15.8 (*)  Neutrophils Relative % 81 (*)    All other components within normal limits  COMPREHENSIVE METABOLIC PANEL - Abnormal; Notable for the following:    Total Protein 8.7 (*)    Alkaline Phosphatase 136 (*)    All other components within normal limits  LIPASE, BLOOD  URINALYSIS, ROUTINE W REFLEX MICROSCOPIC  POC URINE PREG, ED    Imaging Review Dg Chest 2 View  08/02/2014   CLINICAL DATA:  Cough and shortness of breath today. Myalgias and chills.  EXAM: CHEST  2 VIEW  COMPARISON:  01/21/2011.  FINDINGS: Normal  sized heart. Clear lungs. Minimal diffuse peribronchial thickening. Mild scoliosis.  IMPRESSION: Minimal bronchitic changes.   Electronically Signed   By: Beckie SaltsSteven  Reid M.D.   On: 08/02/2014 14:50     EKG Interpretation None      MDM   Final diagnoses:  Viral illness   Meds ordered this encounter  Medications  . aspirin 325 MG EC tablet    Sig: Take 325 mg by mouth as needed (headache.).  Marland Kitchen. albuterol (PROVENTIL HFA;VENTOLIN HFA) 108 (90 BASE) MCG/ACT inhaler 2 puff    Sig:   . oseltamivir (TAMIFLU) 75 MG capsule    Sig: Take 1 capsule (75 mg total) by mouth every 12 (twelve) hours.    Dispense:  10 capsule    Refill:  0    Order Specific Question:  Supervising Provider    Answer:  Gwyneth SproutLUNKETT, WHITNEY [[id number]]      Lonia SkinnerLeslie K DeckerSofia, PA-C 08/02/14 1808  Gwyneth SproutWhitney Plunkett, MD 08/04/14 1422

## 2015-05-08 IMAGING — CT CT CERVICAL SPINE W/O CM
5 of 6 series · 15 of 33 positions shown, 17 images · non-contrast
Comparison: None.

CLINICAL DATA: Pain post trauma

EXAM:
CT HEAD WITHOUT CONTRAST
CT CERVICAL SPINE WITHOUT CONTRAST
TECHNIQUE: Multidetector CT imaging of the head and cervical spine was
performed following the standard protocol without intravenous
contrast. Multiplanar CT image reconstructions of the cervical spine
were also generated.

[Series 3: head w/o bone · axial · non-contrast · 0.49mm/px · z∈[+296,+348]mm · 2 of 63 slices shown]
[im 21/63  bone]
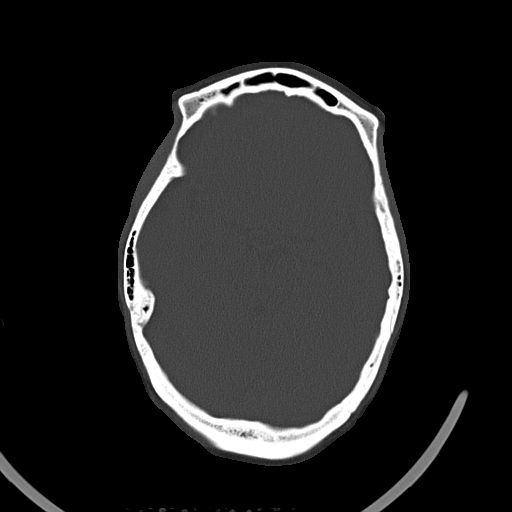
[im 42/63  bone]
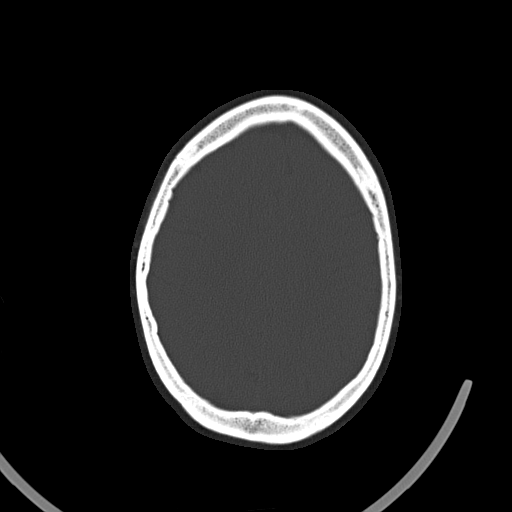

[Series 5: soft tissue · axial · 0.32mm/px · z∈[+141,+229]mm · 3 of 89 slices shown]
[im 23/89  soft-tissue]
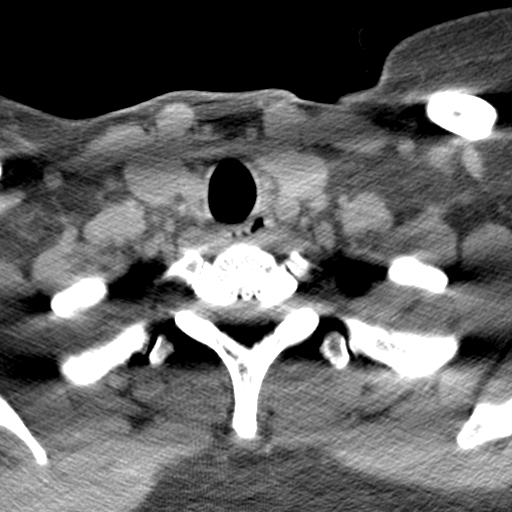
[im 45/89  soft-tissue]
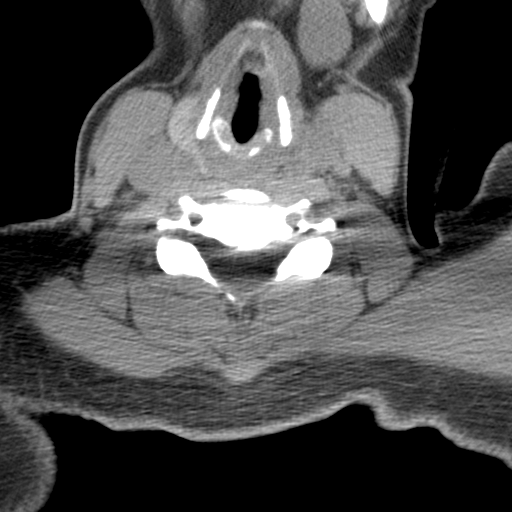
[im 67/89  soft-tissue]
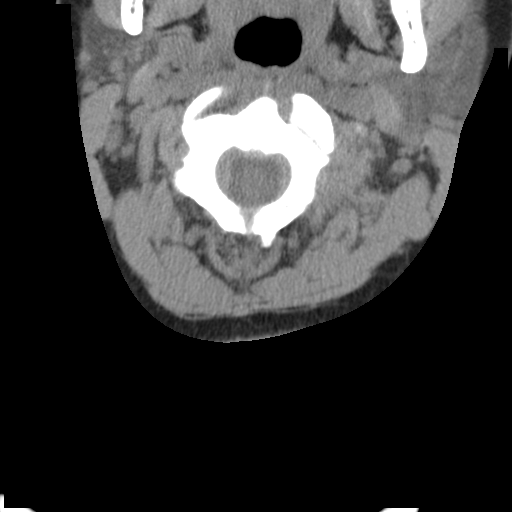

[coronals · coronal · 0.34mm/px · 3 of 39 slices shown]
[im 8/39  bone]
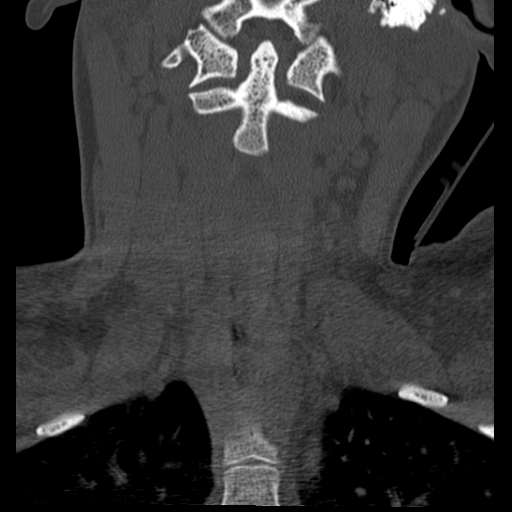
[im 16/39  bone]
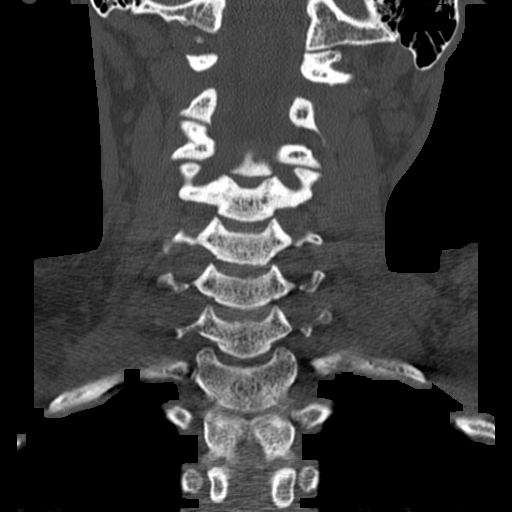
[im 23/39  bone]
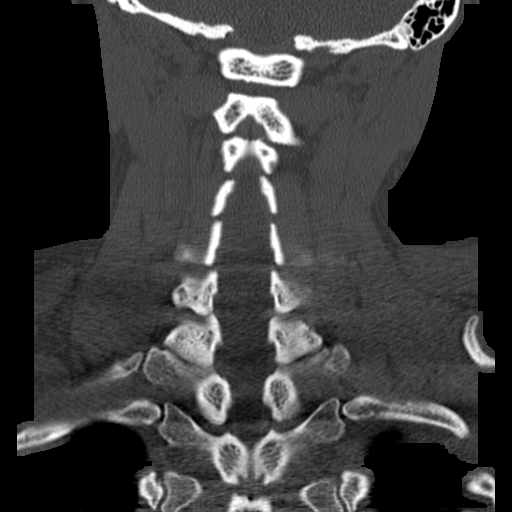

[sagittals · sagittal · 0.34mm/px · 5 of 43 slices shown, 6 images]
[im 15/43  bone]
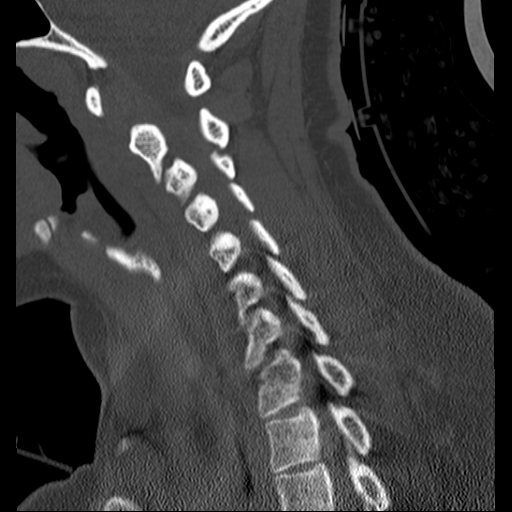
[im 18/43  bone]
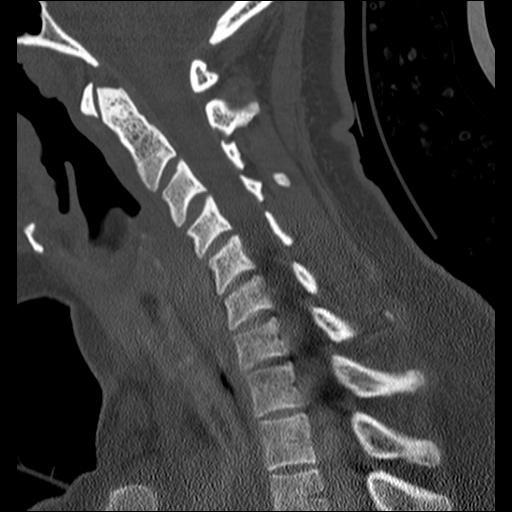
[im 22/43  soft-tissue]
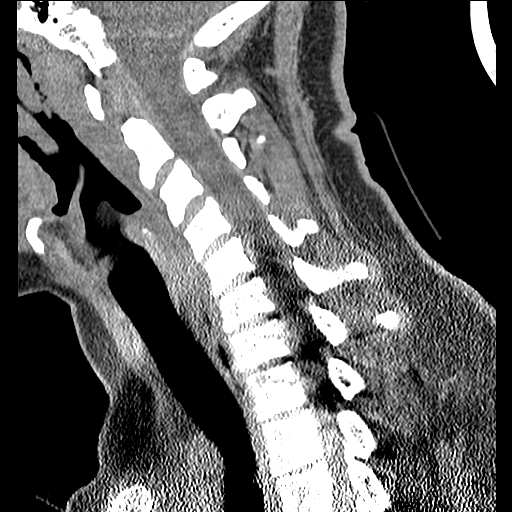
[im 22/43  bone]
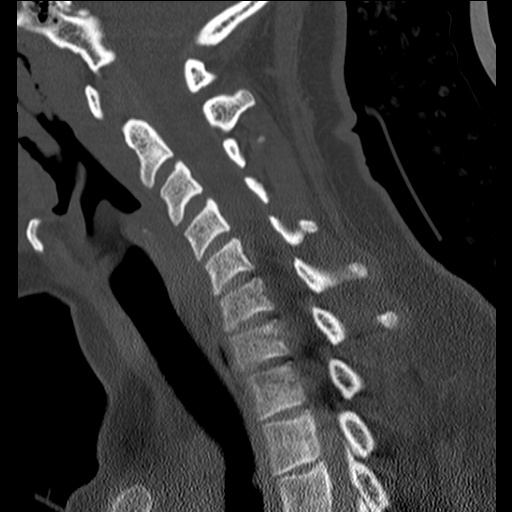
[im 25/43  bone]
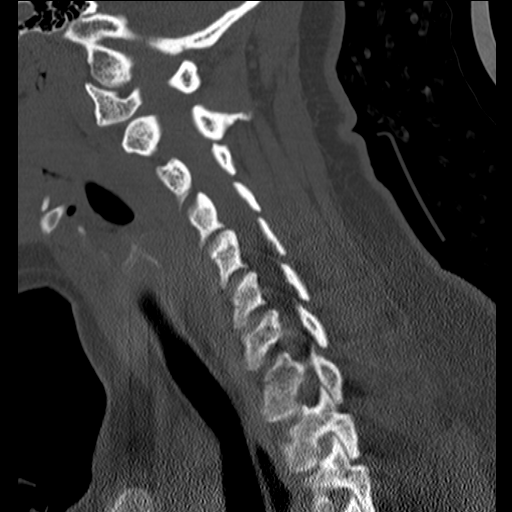
[im 29/43  bone]
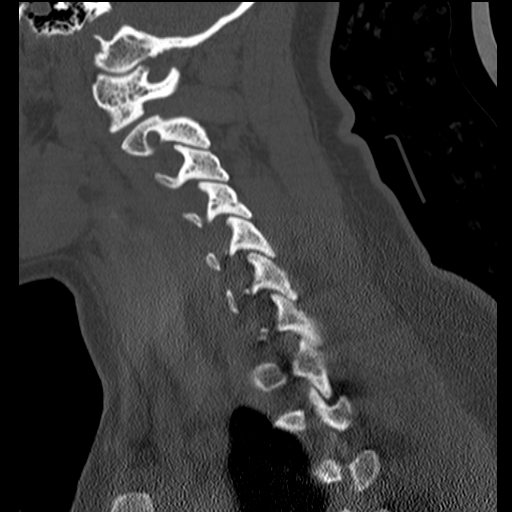

[orthogonals · axial · 0.34mm/px · z∈[+144,+184]mm · 2 of 68 slices shown, 3 images]
[im 23/68  soft-tissue]
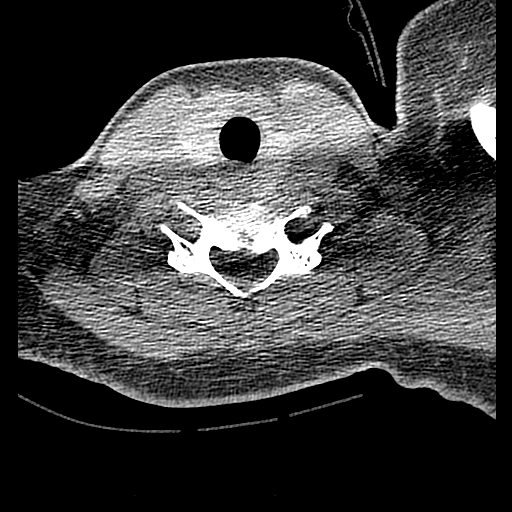
[im 23/68  bone]
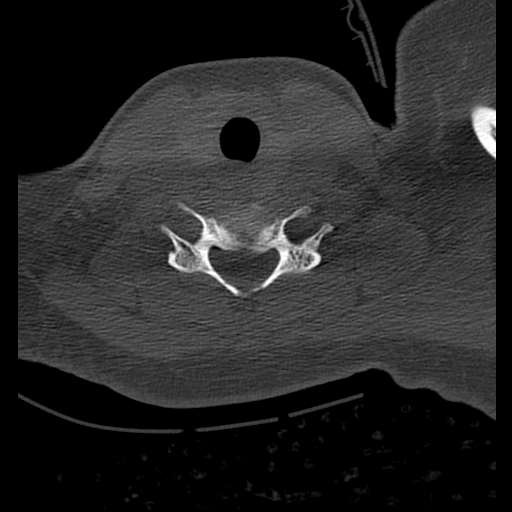
[im 45/68  bone]
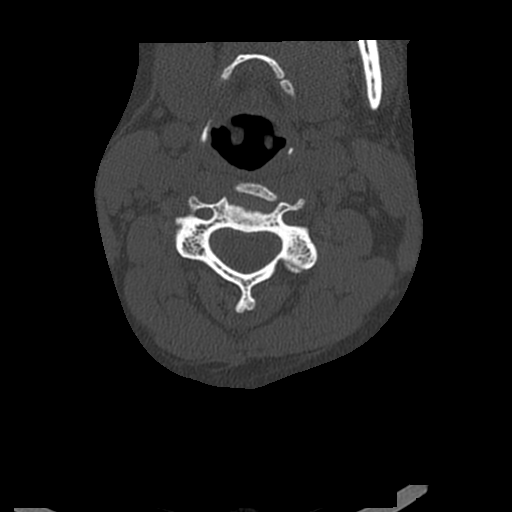

[15 of 33 positions shown; findings below may reference images not displayed]

FINDINGS: CT HEAD FINDINGS

Ventricles are normal in size and configuration. There is no mass,
hemorrhage, extra-axial fluid collection, or midline shift.
Gray-white compartments are normal. Bony calvarium appears intact.
Mastoid air cells are clear. There is mucosal thickening in the
maxillary antra bilaterally, more on the right than on the left.
There is opacification of multiple ethmoid air cells bilaterally.
There is opacification in the inferior right frontal sinus.

CT CERVICAL SPINE FINDINGS

There is no fracture or spondylolisthesis. Prevertebral soft tissues
and predental space regions are normal. Disc spaces appear intact.
No disc extrusion or stenosis. No appreciable arthropathy. There is
mild scoliosis and reversal of lordotic curvature.
IMPRESSION: CT head: Multifocal paranasal sinus disease. Study otherwise
unremarkable.

CT cervical spine: Suspect a degree of muscle spasm. No fracture or
spondylolisthesis. No appreciable arthropathy.

## 2016-05-06 IMAGING — CR DG CHEST 2V
2 series · 2 of 2 positions shown · non-contrast
Comparison: 01/21/2011.

CLINICAL DATA: Cough and shortness of breath today. Myalgias and
chills.

EXAM:
CHEST  2 VIEW

[w chest pa]
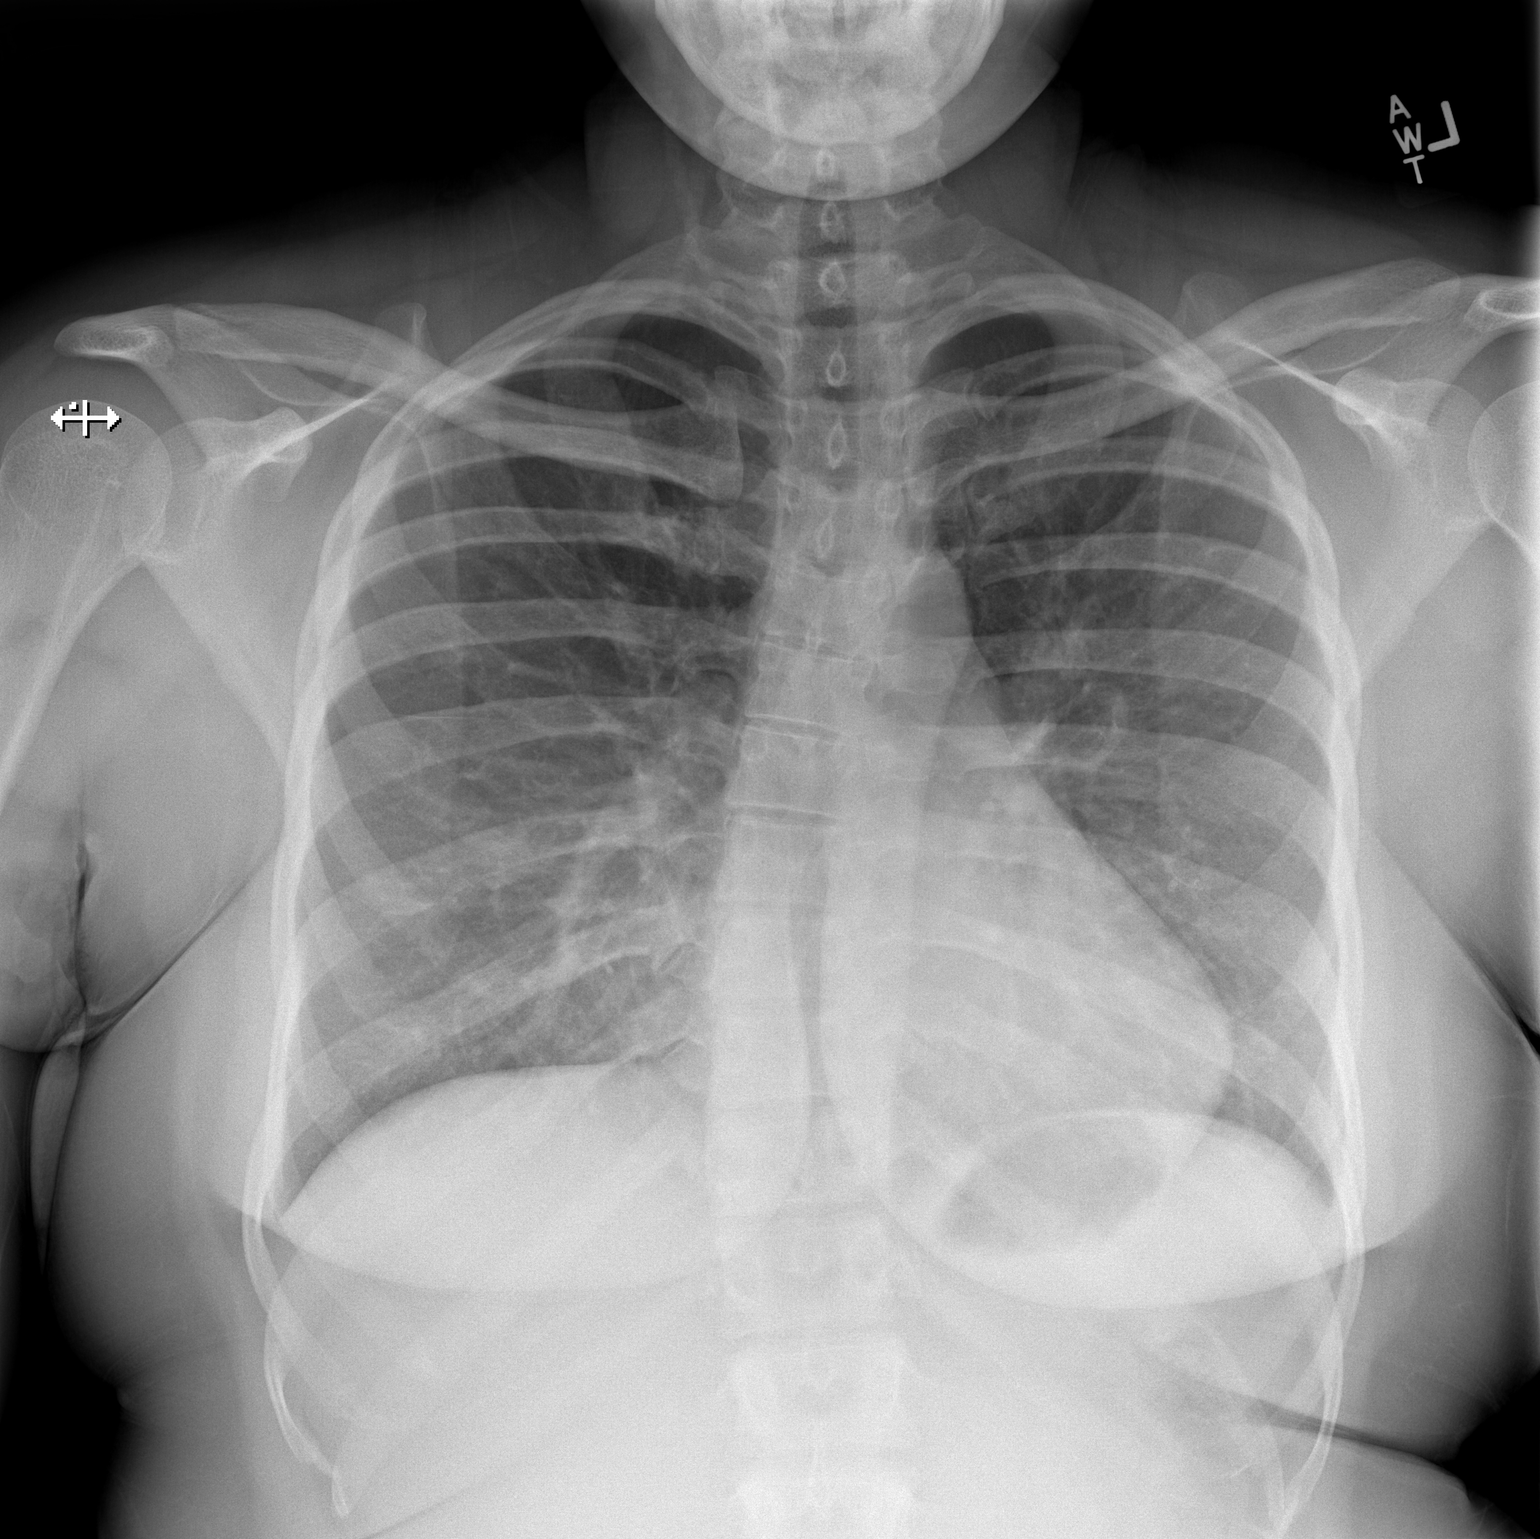

[w chest lat]
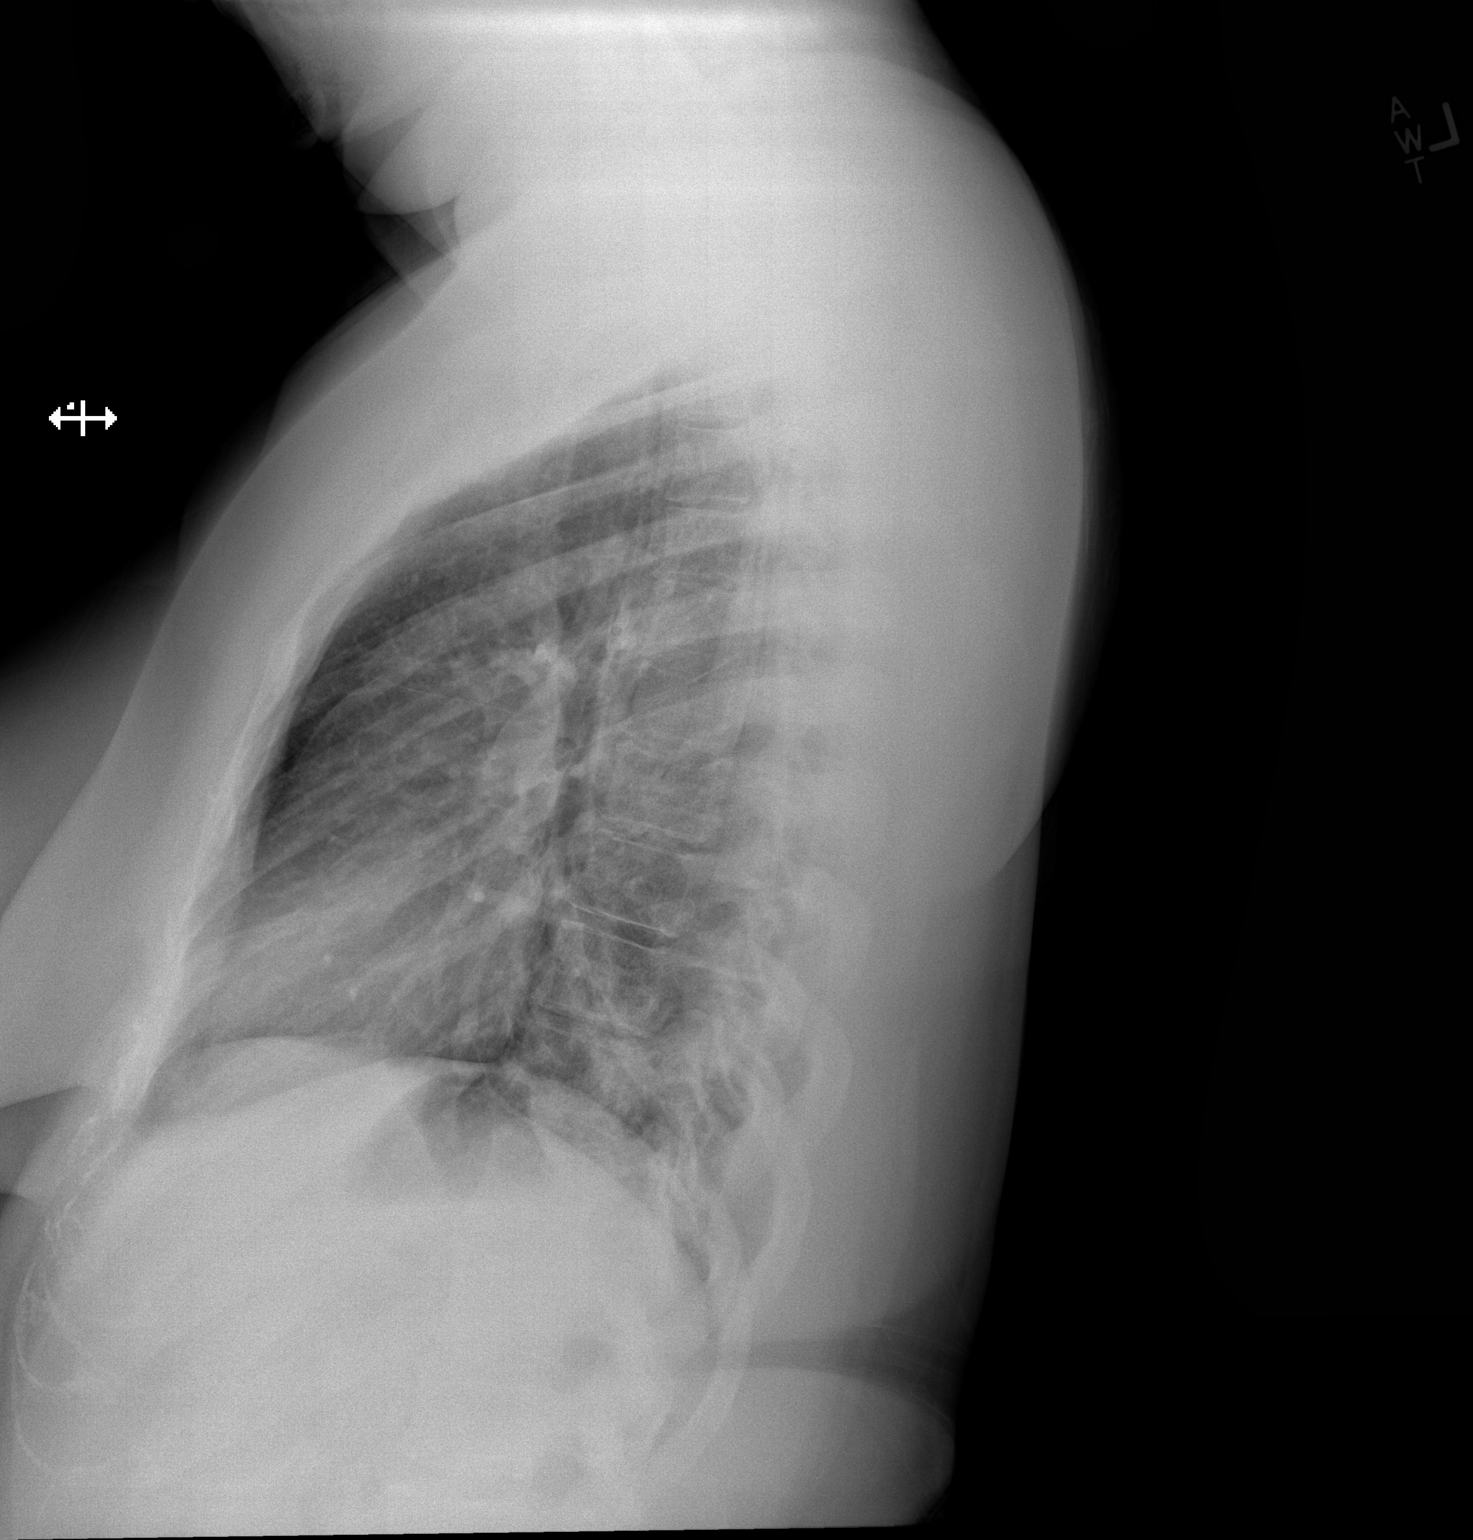

[2 of 2 positions shown; findings below may reference images not displayed]

FINDINGS: Normal sized heart. Clear lungs. Minimal diffuse peribronchial
thickening. Mild scoliosis.
IMPRESSION: Minimal bronchitic changes.

## 2018-09-14 ENCOUNTER — Encounter (HOSPITAL_COMMUNITY): Payer: Self-pay

## 2018-09-14 ENCOUNTER — Ambulatory Visit (HOSPITAL_COMMUNITY)
Admission: EM | Admit: 2018-09-14 | Discharge: 2018-09-14 | Disposition: A | Discharge status: Home / Self Care | Payer: 59 | Attending: Family Medicine | Admitting: Family Medicine

## 2018-09-14 ENCOUNTER — Other Ambulatory Visit: Payer: Self-pay

## 2018-09-14 DIAGNOSIS — M25461 Effusion, right knee: Secondary | ICD-10-CM | POA: Diagnosis not present

## 2018-09-14 DIAGNOSIS — M25561 Pain in right knee: Secondary | ICD-10-CM

## 2018-09-14 MED ORDER — NAPROXEN 500 MG PO TABS: 500.0000 mg | ORAL_TABLET | Freq: Two times a day (BID) | ORAL | 0 refills | Status: AC

## 2018-09-14 NOTE — ED Notes (Signed)
Patient verbalizes understanding of discharge instructions. Opportunity for questioning and answers were provided. Patient discharged from [facility] by MD. 

## 2018-09-14 NOTE — ED Provider Notes (Signed)
[facility]    CSN: [id number] Arrival date & time: 09/14/18  1132     History   Chief Complaint Chief Complaint  Patient presents with  . Fall    HPI Kelly Ware is a 33 y.o. female.   33 year old female comes in for right knee pain and swelling after fall. States she tripped and hit the knee. She states she has mild prominence to the tibial tuberosity already from playing sports when she was young, but it has been more swollen since the fall. She has been doing ice compress with some relief. She has been ambulating on own without difficulty. Denies decrease in ROM, numbness/tingling. Has not taken any medicine for it.      Past Medical History:  Diagnosis Date  . Anemia     Patient Active Problem List   Diagnosis Date Noted  . Left shoulder strain 08/11/2013  . Strain of lumbar region 03/12/2011    History reviewed. No pertinent surgical history.  OB History   No obstetric history on file.      Home Medications    Prior to Admission medications   Medication Sig Start Date End Date Taking? Authorizing Provider  aspirin 325 MG EC tablet Take 325 mg by mouth as needed (headache.).    [provider]  naproxen (NAPROSYN) 500 MG tablet Take 1 tablet (500 mg total) by mouth 2 (two) times daily for 10 days. 09/14/18 09/24/18  Belinda FisherYu, Sidharth Leverette V, PA-C  oseltamivir (TAMIFLU) 75 MG capsule Take 1 capsule (75 mg total) by mouth every 12 (twelve) hours. 08/02/14   Elson AreasSofia, Leslie K, PA-C  traMADol (ULTRAM) 50 MG tablet Take 1 tablet (50 mg total) by mouth every 8 (eight) hours as needed for severe pain. Patient not taking: Reported on 08/02/2014 08/11/13   Dessa PhiFunches, Josalyn, MD    Family History Family History  Problem Relation Age of Onset  . Heart disease Maternal Grandfather     Social History Social History   Tobacco Use  . Smoking status: Former Smoker    Types: Cigarettes    Last attempt to quit: 05/18/2013    Years since quitting: 5.3  . Smokeless  tobacco: Never Used  . Tobacco comment: smokes some times  Substance Use Topics  . Alcohol use: No  . Drug use: No     Allergies   Patient has no known allergies.   Review of Systems Review of Systems  Reason unable to perform ROS: See HPI as above.     Physical Exam Triage Vital Signs ED Triage Vitals [09/14/18 1218]  Enc Vitals Group     BP 122/79     Pulse Rate 72     Resp 18     Temp 98.4 F (36.9 C)     Temp Source Oral     SpO2 100 %     Weight      Height      Head Circumference      Peak Flow      Pain Score      Pain Loc      Pain Edu?      Excl. in GC?    No data found.  Updated Vital Signs BP 122/79 (BP Location: Left Arm)   Pulse 72   Temp 98.4 F (36.9 C) (Oral)   Resp 18   LMP 07/29/2018   SpO2 100%   Physical Exam Constitutional:      General: She is not in acute  distress.    Appearance: She is well-developed. She is not diaphoretic.  HENT:     Head: Normocephalic and atraumatic.  Eyes:     Conjunctiva/sclera: Conjunctivae normal.     Pupils: Pupils are equal, round, and reactive to light.  Musculoskeletal:     Comments: Swelling to the right tibial tuberosity. No erythema. Mild warmth. No tenderness to palpation of the patellar, joint line. Mild tenderness to the tibial tuberosity. Full ROM of knee. Strength normal and equal bilaterally. Sensation intact and equal bilaterally.   Neurological:     Mental Status: She is alert and oriented to person, place, and time.     UC Treatments / Results  Labs (all labs ordered are listed, but only abnormal results are displayed) Labs Reviewed - No data to display  EKG None  Radiology No results found.  Procedures Procedures (including critical care time)  Medications Ordered in UC Medications - No data to display  Initial Impression / Assessment and Plan / UC Course  I have reviewed the triage vital signs and the nursing notes.  Pertinent labs & imaging results that were  available during my care of the patient were reviewed by me and considered in my medical decision making (see chart for details).    No alarming signs on exam. Given full ROM with normal strength, weightbearing without problems, low suspicion for fracture. Warmth likely due to inflammation. Start NSAIDs as directed. Ice compress, elevation. Return precautions given. Patient expresses understanding and agrees to plan.  Final Clinical Impressions(s) / UC Diagnoses   Final diagnoses:  Pain and swelling of right knee   ED Prescriptions    Medication Sig Dispense Auth. Provider   naproxen (NAPROSYN) 500 MG tablet Take 1 tablet (500 mg total) by mouth 2 (two) times daily for 10 days. 20 tablet Threasa Alpha, [place] 09/14/18 1253

## 2018-09-14 NOTE — Discharge Instructions (Signed)
Start naproxen as directed for pain and inflammation. Continue ice compress, elevation. This may take a few weeks to completely resolve, but should be feeling better each week. Follow up with PCP for further evaluation if symptoms not improving.

## 2018-09-14 NOTE — ED Triage Notes (Signed)
Pt states she fell on her right knee last night. Pt states it's swelling up. Pt tripped over some clothes and hit the hard wood floor.

## 2020-05-29 ENCOUNTER — Encounter (HOSPITAL_BASED_OUTPATIENT_CLINIC_OR_DEPARTMENT_OTHER): Payer: Self-pay | Admitting: Orthopaedic Surgery

## 2020-05-29 ENCOUNTER — Other Ambulatory Visit: Payer: Self-pay

## 2020-06-01 ENCOUNTER — Other Ambulatory Visit (HOSPITAL_COMMUNITY)
Admission: RE | Admit: 2020-06-01 | Discharge: 2020-06-01 | Disposition: A | Discharge status: Home / Self Care | Payer: 59 | Source: Ambulatory Visit | Attending: Orthopaedic Surgery | Admitting: Orthopaedic Surgery

## 2020-06-01 DIAGNOSIS — U071 COVID-19: Secondary | ICD-10-CM | POA: Insufficient documentation

## 2020-06-01 DIAGNOSIS — Z01812 Encounter for preprocedural laboratory examination: Secondary | ICD-10-CM | POA: Diagnosis not present

## 2020-06-01 DIAGNOSIS — Z8616 Personal history of COVID-19: Secondary | ICD-10-CM

## 2020-06-01 HISTORY — DX: Personal history of COVID-19: Z86.16

## 2020-06-01 LAB — SARS CORONAVIRUS 2 (TAT 6-24 HRS): SARS Coronavirus 2: POSITIVE — AB

## 2020-06-02 NOTE — Progress Notes (Signed)
Unable to reach anyone from the on-call service, so an in box message sent to Dr. Everardo Pacific with + covid results for this pt. The pt is to have surgery on Wed. 06/05/20 at [facility] [place] at 12:30pm. The pt has not been notified as the surgeon may need to answer pertinent questions.   These are the current guidelines:  Positive Results for:  Asymptomatic: Procedure postponed and quarantined for 10 days. Symptomatic: Procedure postponed and quarantined for 14 days. Hospitalized with Covid: postponed and quarantined  for 21 days. Immunocompromised: Procedure postponed and quarantine for 20 days.  The pt will not be retested for 90 days from the + result.

## 2020-06-21 ENCOUNTER — Encounter (HOSPITAL_BASED_OUTPATIENT_CLINIC_OR_DEPARTMENT_OTHER): Payer: Self-pay | Admitting: Orthopaedic Surgery

## 2020-06-21 ENCOUNTER — Other Ambulatory Visit: Payer: Self-pay

## 2020-06-25 NOTE — H&P (Signed)
PREOPERATIVE H&P  Chief Complaint: RIGHT KNEE BURSITIS, RUPTURE OF EXTENSOR TENDONS LOWER LEG  HPI: Kelly Ware is a 35 y.o. female who is scheduled for, Procedure(s): EXCISION PREPATELLAR BURSA SUTURE REPAIR INFRAPATELLAR TENDON PRIMARY.   Patient has no pertinent past medical history significant.   The patient is seen in consultation from Christena Deem, MD for right knee pain. The patient has had knee pain and swelling in the anterior portion of the knee for about a year. This is worsening with working out. She saw Dr. Lyn Hollingshead and had an ultrasound and was placed on Mobic and did some stretches but she has not made much progress. She is a Production designer, theatre/television/film at [employer]. She is not happy with her function currently. She denies any fever or chills.  No recent weight loss.  No red flag symptoms.    Her symptoms are rated as moderate to severe, and have been worsening.  This is significantly impairing activities of daily living.    Please see clinic note for further details on this patient's care.    She has elected for surgical management.   Past Medical History:  Diagnosis Date  . Anemia   . History of COVID-19 06/01/2020   Past Surgical History:  Procedure Laterality Date  . NO PAST SURGERIES     Social History   Socioeconomic History  . Marital status: Single    Spouse name: Not on file  . Number of children: Not on file  . Years of education: Not on file  . Highest education level: Not on file  Occupational History  . Not on file  Tobacco Use  . Smoking status: Former Smoker    Types: Cigarettes    Quit date: 05/18/2013    Years since quitting: 7.1  . Smokeless tobacco: Never Used  . Tobacco comment: smokes some times  Substance and Sexual Activity  . Alcohol use: No    Comment: occas  . Drug use: No  . Sexual activity: Not on file    Comment: female partner  Other Topics Concern  . Not on file  Social History Narrative  . Not on file   Social Determinants  of Health   Financial Resource Strain: Not on file  Food Insecurity: Not on file  Transportation Needs: Not on file  Physical Activity: Not on file  Stress: Not on file  Social Connections: Not on file   Family History  Problem Relation Age of Onset  . Heart disease Maternal Grandfather    No Known Allergies Prior to Admission medications   Not on File    ROS: All other systems have been reviewed and were otherwise negative with the exception of those mentioned in the HPI and as above.  Physical Exam: General: Alert, no acute distress Cardiovascular: No pedal edema Respiratory: No cyanosis, no use of accessory musculature GI: No organomegaly, abdomen is soft and non-tender Skin: No lesions in the area of chief complaint Neurologic: Sensation intact distally Psychiatric: Patient is competent for consent with normal mood and affect Lymphatic: No axillary or cervical lymphadenopathy  MUSCULOSKELETAL:  Right knee is tender to palpation.  Fullness at the infrapatellar region in the tibial tubercle.  There is a good extensor mechanism.  No surrounding erythema or redness.   Imaging: MRI with contrast demonstrates a well circumscribed lesion in the subcutaneous region just anterior to the tibial tubercle.  It appears on the anteromedial aspect it may be involving the patellar tendon, it is unclear though.  No  sign of interference of the bone.  No other worrisome findings on MRI.  Radiologist review demonstrates unlikely neoplasm.    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    Plan: Plan for Procedure(s): EXCISION PREPATELLAR BURSA SUTURE REPAIR INFRAPATELLAR TENDON PRIMARY  The risks benefits and alternatives were discussed with the patient including but not limited to the risks of nonoperative treatment, versus surgical intervention including infection, bleeding, nerve injury,  blood clots, cardiopulmonary  complications, morbidity, mortality, among others, and they were willing to proceed.   The patient acknowledged the explanation, agreed to proceed with the plan and consent was signed.   Operative Plan: Right knee excisional biopsy of infrapatellar prepatellar mass with possible patellar tendon repair  Discharge Medications: Standard DVT Prophylaxis: Aspirin  Physical Therapy: +/- Special Discharge needs: +/- knee immobilizer   Vernetta Honey, PA-C  06/25/2020 5:04 PM

## 2020-06-26 NOTE — Progress Notes (Signed)

## 2020-06-27 ENCOUNTER — Encounter (HOSPITAL_BASED_OUTPATIENT_CLINIC_OR_DEPARTMENT_OTHER): Payer: Self-pay | Admitting: Orthopaedic Surgery

## 2020-06-27 ENCOUNTER — Ambulatory Visit (HOSPITAL_BASED_OUTPATIENT_CLINIC_OR_DEPARTMENT_OTHER): Payer: 59 | Admitting: Anesthesiology

## 2020-06-27 ENCOUNTER — Other Ambulatory Visit: Payer: Self-pay

## 2020-06-27 ENCOUNTER — Encounter (HOSPITAL_BASED_OUTPATIENT_CLINIC_OR_DEPARTMENT_OTHER)
Admission: RE | Disposition: A | Discharge status: Home / Self Care | Payer: Self-pay | Source: Home / Self Care | Attending: Orthopaedic Surgery

## 2020-06-27 ENCOUNTER — Ambulatory Visit (HOSPITAL_BASED_OUTPATIENT_CLINIC_OR_DEPARTMENT_OTHER)
Admission: RE | Admit: 2020-06-27 | Discharge: 2020-06-27 | Disposition: A | Discharge status: Home / Self Care | Payer: 59 | Attending: Orthopaedic Surgery | Admitting: Orthopaedic Surgery

## 2020-06-27 DIAGNOSIS — R2241 Localized swelling, mass and lump, right lower limb: Secondary | ICD-10-CM | POA: Diagnosis present

## 2020-06-27 DIAGNOSIS — Z87891 Personal history of nicotine dependence: Secondary | ICD-10-CM | POA: Diagnosis not present

## 2020-06-27 DIAGNOSIS — Z8616 Personal history of COVID-19: Secondary | ICD-10-CM | POA: Diagnosis not present

## 2020-06-27 DIAGNOSIS — M65861 Other synovitis and tenosynovitis, right lower leg: Secondary | ICD-10-CM | POA: Insufficient documentation

## 2020-06-27 HISTORY — PX: MASS EXCISION: SHX2000

## 2020-06-27 LAB — POCT PREGNANCY, URINE: Preg Test, Ur: NEGATIVE

## 2020-06-27 SURGERY — EXCISION MASS
Anesthesia: General | Site: Knee | Laterality: Right

## 2020-06-27 MED ORDER — FENTANYL CITRATE (PF) 100 MCG/2ML IJ SOLN
INTRAMUSCULAR | Status: DC | PRN
  Administered 2020-06-27 (×5): 50 ug via INTRAVENOUS

## 2020-06-27 MED ORDER — PROMETHAZINE HCL 25 MG/ML IJ SOLN: 6.2500 mg | INTRAMUSCULAR | Status: DC | PRN

## 2020-06-27 MED ORDER — CEFAZOLIN SODIUM-DEXTROSE 2-3 GM-%(50ML) IV SOLR
INTRAVENOUS | Status: DC | PRN
  Administered 2020-06-27: 3 g via INTRAVENOUS

## 2020-06-27 MED ORDER — ONDANSETRON HCL 4 MG PO TABS: 4.0000 mg | ORAL_TABLET | Freq: Three times a day (TID) | ORAL | 1 refills | Status: AC | PRN

## 2020-06-27 MED ORDER — HYDROMORPHONE HCL 1 MG/ML IJ SOLN
INTRAMUSCULAR | Status: AC
  Filled 2020-06-27: qty 0.5

## 2020-06-27 MED ORDER — FENTANYL CITRATE (PF) 100 MCG/2ML IJ SOLN
INTRAMUSCULAR | Status: AC
  Filled 2020-06-27: qty 2

## 2020-06-27 MED ORDER — MIDAZOLAM HCL 2 MG/2ML IJ SOLN
INTRAMUSCULAR | Status: AC
  Filled 2020-06-27: qty 2

## 2020-06-27 MED ORDER — DEXAMETHASONE SODIUM PHOSPHATE 10 MG/ML IJ SOLN
INTRAMUSCULAR | Status: AC
  Filled 2020-06-27: qty 1

## 2020-06-27 MED ORDER — OXYCODONE HCL 5 MG PO TABS: ORAL_TABLET | ORAL | 0 refills | Status: AC

## 2020-06-27 MED ORDER — VANCOMYCIN HCL 1 G IV SOLR
INTRAVENOUS | Status: DC | PRN
  Administered 2020-06-27: 1000 mg via TOPICAL

## 2020-06-27 MED ORDER — ASPIRIN 81 MG PO CHEW: 81.0000 mg | CHEWABLE_TABLET | Freq: Two times a day (BID) | ORAL | 0 refills | Status: AC

## 2020-06-27 MED ORDER — MIDAZOLAM HCL 2 MG/2ML IJ SOLN
INTRAMUSCULAR | Status: DC | PRN
  Administered 2020-06-27: 2 mg via INTRAVENOUS

## 2020-06-27 MED ORDER — CEFAZOLIN SODIUM-DEXTROSE 2-4 GM/100ML-% IV SOLN
INTRAVENOUS | Status: AC
  Filled 2020-06-27: qty 100

## 2020-06-27 MED ORDER — OXYCODONE HCL 5 MG/5ML PO SOLN: 5.0000 mg | Freq: Once | ORAL | Status: AC | PRN

## 2020-06-27 MED ORDER — LACTATED RINGERS IV SOLN: INTRAVENOUS | Status: DC | PRN

## 2020-06-27 MED ORDER — ONDANSETRON HCL 4 MG/2ML IJ SOLN
INTRAMUSCULAR | Status: DC | PRN
  Administered 2020-06-27: 4 mg via INTRAVENOUS

## 2020-06-27 MED ORDER — HYDROMORPHONE HCL 1 MG/ML IJ SOLN
0.2500 mg | INTRAMUSCULAR | Status: DC | PRN
  Administered 2020-06-27 (×4): 0.5 mg via INTRAVENOUS

## 2020-06-27 MED ORDER — MEPERIDINE HCL 25 MG/ML IJ SOLN: 6.2500 mg | INTRAMUSCULAR | Status: DC | PRN

## 2020-06-27 MED ORDER — DEXTROSE 5 % IV SOLN
3.0000 g | INTRAVENOUS | Status: DC
  Filled 2020-06-27: qty 3000

## 2020-06-27 MED ORDER — DEXAMETHASONE SODIUM PHOSPHATE 10 MG/ML IJ SOLN
INTRAMUSCULAR | Status: DC | PRN
  Administered 2020-06-27: 5 mg via INTRAVENOUS

## 2020-06-27 MED ORDER — ONDANSETRON HCL 4 MG/2ML IJ SOLN
INTRAMUSCULAR | Status: AC
  Filled 2020-06-27: qty 2

## 2020-06-27 MED ORDER — AMISULPRIDE (ANTIEMETIC) 5 MG/2ML IV SOLN: 10.0000 mg | Freq: Once | INTRAVENOUS | Status: DC | PRN

## 2020-06-27 MED ORDER — OXYCODONE HCL 5 MG PO TABS
5.0000 mg | ORAL_TABLET | Freq: Once | ORAL | Status: AC | PRN
  Administered 2020-06-27: 5 mg via ORAL

## 2020-06-27 MED ORDER — ACETAMINOPHEN 500 MG PO TABS: 1000.0000 mg | ORAL_TABLET | Freq: Three times a day (TID) | ORAL | 0 refills | Status: AC

## 2020-06-27 MED ORDER — PROPOFOL 10 MG/ML IV BOLUS
INTRAVENOUS | Status: AC
  Filled 2020-06-27: qty 20

## 2020-06-27 MED ORDER — MELOXICAM 7.5 MG PO TABS: 7.5000 mg | ORAL_TABLET | Freq: Every day | ORAL | 0 refills | Status: AC

## 2020-06-27 MED ORDER — LACTATED RINGERS IV SOLN: INTRAVENOUS | Status: DC

## 2020-06-27 MED ORDER — DEXMEDETOMIDINE HCL 200 MCG/2ML IV SOLN
INTRAVENOUS | Status: DC | PRN
  Administered 2020-06-27: 12 ug via INTRAVENOUS
  Administered 2020-06-27 (×2): 4 ug via INTRAVENOUS

## 2020-06-27 MED ORDER — CEFAZOLIN SODIUM-DEXTROSE 1-4 GM/50ML-% IV SOLN
INTRAVENOUS | Status: AC
  Filled 2020-06-27: qty 50

## 2020-06-27 MED ORDER — BUPIVACAINE HCL (PF) 0.25 % IJ SOLN
INTRAMUSCULAR | Status: DC | PRN
  Administered 2020-06-27: 20 mL

## 2020-06-27 MED ORDER — OXYCODONE HCL 5 MG PO TABS
ORAL_TABLET | ORAL | Status: AC
  Filled 2020-06-27: qty 1

## 2020-06-27 SURGICAL SUPPLY — 89 items
APL PRP STRL LF DISP 70% ISPRP (MISCELLANEOUS) ×1
APL SKNCLS STERI-STRIP NONHPOA (GAUZE/BANDAGES/DRESSINGS)
BANDAGE ESMARK 6X9 LF (GAUZE/BANDAGES/DRESSINGS) IMPLANT
BENZOIN TINCTURE PRP APPL 2/3 (GAUZE/BANDAGES/DRESSINGS) IMPLANT
BLADE SURG 10 STRL SS (BLADE) ×2 IMPLANT
BLADE SURG 15 STRL LF DISP TIS (BLADE) ×5 IMPLANT
BLADE SURG 15 STRL SS (BLADE) ×10
BNDG CMPR 9X6 STRL LF SNTH (GAUZE/BANDAGES/DRESSINGS)
BNDG COHESIVE 4X5 TAN STRL (GAUZE/BANDAGES/DRESSINGS) IMPLANT
BNDG ELASTIC 4X5.8 VLCR STR LF (GAUZE/BANDAGES/DRESSINGS) ×2 IMPLANT
BNDG ELASTIC 6X5.8 VLCR STR LF (GAUZE/BANDAGES/DRESSINGS) ×2 IMPLANT
BNDG ESMARK 6X9 LF (GAUZE/BANDAGES/DRESSINGS)
CHLORAPREP W/TINT 26 (MISCELLANEOUS) ×2 IMPLANT
CLSR STERI-STRIP ANTIMIC 1/2X4 (GAUZE/BANDAGES/DRESSINGS) ×2 IMPLANT
COVER BACK TABLE 60X90IN (DRAPES) IMPLANT
COVER MAYO STAND STRL (DRAPES) IMPLANT
COVER WAND RF STERILE (DRAPES) IMPLANT
CUFF TOURN SGL QUICK 18X4 (TOURNIQUET CUFF) IMPLANT
CUFF TOURN SGL QUICK 24 (TOURNIQUET CUFF)
CUFF TOURN SGL QUICK 34 (TOURNIQUET CUFF) ×2
CUFF TOURN SGL QUICK 42 (TOURNIQUET CUFF) ×2 IMPLANT
CUFF TRNQT CYL 24X4X16.5-23 (TOURNIQUET CUFF) IMPLANT
CUFF TRNQT CYL 34X4.125X (TOURNIQUET CUFF) ×1 IMPLANT
DECANTER SPIKE VIAL GLASS SM (MISCELLANEOUS) ×2 IMPLANT
DRAPE C-ARM 42X72 X-RAY (DRAPES) IMPLANT
DRAPE C-ARMOR (DRAPES) IMPLANT
DRAPE EXTREMITY T 121X128X90 (DISPOSABLE) ×2 IMPLANT
DRAPE IMP U-DRAPE 54X76 (DRAPES) IMPLANT
DRAPE INCISE IOBAN 66X45 STRL (DRAPES) IMPLANT
DRAPE U-SHAPE 47X51 STRL (DRAPES) IMPLANT
DRSG AQUACEL AG ADV 3.5X10 (GAUZE/BANDAGES/DRESSINGS) IMPLANT
ELECT REM PT RETURN 9FT ADLT (ELECTROSURGICAL) ×2
ELECTRODE REM PT RTRN 9FT ADLT (ELECTROSURGICAL) ×1 IMPLANT
GAUZE SPONGE 4X4 12PLY STRL (GAUZE/BANDAGES/DRESSINGS) ×2 IMPLANT
GAUZE XEROFORM 1X8 LF (GAUZE/BANDAGES/DRESSINGS) IMPLANT
GLOVE ECLIPSE 8.0 STRL XLNG CF (GLOVE) ×2 IMPLANT
GLOVE SRG 8 PF TXTR STRL LF DI (GLOVE) ×1 IMPLANT
GLOVE SURG ENC MOIS LTX SZ6.5 (GLOVE) IMPLANT
GLOVE SURG LTX SZ6.5 (GLOVE) ×4 IMPLANT
GLOVE SURG UNDER POLY LF SZ6.5 (GLOVE) ×2 IMPLANT
GLOVE SURG UNDER POLY LF SZ7 (GLOVE) ×4 IMPLANT
GLOVE SURG UNDER POLY LF SZ8 (GLOVE) ×2
GOWN STRL REUS W/ TWL LRG LVL3 (GOWN DISPOSABLE) ×2 IMPLANT
GOWN STRL REUS W/TWL LRG LVL3 (GOWN DISPOSABLE) ×4
GOWN STRL REUS W/TWL XL LVL3 (GOWN DISPOSABLE) ×2 IMPLANT
IMMOBILIZER KNEE 22 UNIV (SOFTGOODS) ×2 IMPLANT
IMMOBILIZER KNEE 24 THIGH 36 (MISCELLANEOUS) IMPLANT
IMMOBILIZER KNEE 24 UNIV (MISCELLANEOUS)
KIT ASCP FXDISP 3X8XBTNDS (KITS) IMPLANT
KIT BIO-TENODESIS 3X8 DISP (KITS)
MANIFOLD NEPTUNE II (INSTRUMENTS) ×2 IMPLANT
NDL SUT 6 .5 CRC .975X.05 MAYO (NEEDLE) IMPLANT
NEEDLE MAYO TAPER (NEEDLE)
NEEDLE MAYO TROCAR (NEEDLE) IMPLANT
NS IRRIG 1000ML POUR BTL (IV SOLUTION) ×2 IMPLANT
PACK ARTHROSCOPY DSU (CUSTOM PROCEDURE TRAY) ×2 IMPLANT
PACK BASIN DAY SURGERY FS (CUSTOM PROCEDURE TRAY) ×2 IMPLANT
PAD CAST 4YDX4 CTTN HI CHSV (CAST SUPPLIES) IMPLANT
PADDING CAST ABS 6INX4YD NS (CAST SUPPLIES)
PADDING CAST ABS COTTON 6X4 NS (CAST SUPPLIES) IMPLANT
PADDING CAST COTTON 4X4 STRL (CAST SUPPLIES)
PENCIL SMOKE EVACUATOR (MISCELLANEOUS) ×2 IMPLANT
RETRIEVER SUT HEWSON (MISCELLANEOUS) IMPLANT
SLEEVE SCD COMPRESS KNEE MED (MISCELLANEOUS) IMPLANT
SPONGE LAP 18X18 RF (DISPOSABLE) ×2 IMPLANT
STAPLER VISISTAT 35W (STAPLE) IMPLANT
SUCTION FRAZIER HANDLE 10FR (MISCELLANEOUS)
SUCTION TUBE FRAZIER 10FR DISP (MISCELLANEOUS) IMPLANT
SUT BONE WAX W31G (SUTURE) ×2 IMPLANT
SUT FIBERWIRE #2 38 REV NDL BL (SUTURE)
SUT FIBERWIRE #5 38 CONV NDL (SUTURE)
SUT MNCRL AB 4-0 PS2 18 (SUTURE) IMPLANT
SUT VIC AB 0 CT1 18XCR BRD 8 (SUTURE) IMPLANT
SUT VIC AB 0 CT1 27 (SUTURE) ×4
SUT VIC AB 0 CT1 27XBRD ANBCTR (SUTURE) ×2 IMPLANT
SUT VIC AB 0 CT1 8-18 (SUTURE)
SUT VIC AB 2-0 CT1 27 (SUTURE)
SUT VIC AB 2-0 CT1 TAPERPNT 27 (SUTURE) IMPLANT
SUT VIC AB 2-0 SH 27 (SUTURE)
SUT VIC AB 2-0 SH 27XBRD (SUTURE) IMPLANT
SUT VIC AB 3-0 SH 27 (SUTURE) ×2
SUT VIC AB 3-0 SH 27X BRD (SUTURE) ×1 IMPLANT
SUTURE FIBERWR #5 38 CONV NDL (SUTURE) IMPLANT
SUTURE FIBERWR#2 38 REV NDL BL (SUTURE) IMPLANT
SUTURE TAPE 1.3 FIBERLOP 20 ST (SUTURE) IMPLANT
SUTURETAPE 1.3 FIBERLOOP 20 ST (SUTURE)
SYR BULB EAR ULCER 3OZ GRN STR (SYRINGE) ×2 IMPLANT
TOWEL GREEN STERILE FF (TOWEL DISPOSABLE) ×4 IMPLANT
TUBE SUCTION HIGH CAP CLEAR NV (SUCTIONS) ×4 IMPLANT

## 2020-06-27 NOTE — Transfer of Care (Signed)
Immediate Anesthesia Transfer of Care Note  Patient: Kelly Ware  Procedure(s) Performed: EXCISION MASS (Right Knee)  Patient Location: PACU  Anesthesia Type:General  Level of Consciousness: drowsy  Airway & Oxygen Therapy: Patient Spontanous Breathing and Patient connected to face mask oxygen  Post-op Assessment: Report given to RN and Post -op Vital signs reviewed and stable  Post vital signs: Reviewed and stable  Last Vitals:  Vitals Value Taken Time  BP 127/64 06/27/20 1221  Temp    Pulse 80 06/27/20 1223  Resp 22 06/27/20 1223  SpO2 97 % 06/27/20 1223  Vitals shown include unvalidated device data.  Last Pain:  Vitals:   06/27/20 1017  TempSrc: Oral  PainSc: 8          Complications: No complications documented.

## 2020-06-27 NOTE — Discharge Instructions (Signed)
YOU HAD 5 MG OF OXYCODONE AT 2:00pm.      Ramond Marrow MD, MPH Alfonse Alpers, PA-C [facility] [address], [address] [phone] (tel)   [phone] (fax)   POST-OPERATIVE INSTRUCTIONS  WOUND CARE  You may remove the Operative Dressing on Post-Op Day #3 (72hrs after surgery).    Leave steri strips in place.    If you feel more comfortable with it you can leave all dressings in place till your 1 week follow-up appointment.    KEEP THE INCISIONS CLEAN AND DRY.  An ACE wrap may be used to control swelling, do not wrap this too tight.  If the initial ACE wrap feels too tight or constricting you may loosen it.  There may be a small amount of fluid/bleeding leaking at the surgical site.   This is normal; the knee is filled with fluid during the procedure and can leak for 24-48hrs after surgery.   You may change/reinforce the bandage as needed.   Use the Cryocuff, GameReady or Ice as often as possible for the first 3-4 days, then as needed for pain relief. Always keep a towel, ACE wrap or other barrier between the cooling unit and your skin.   You may shower on Post-Op Day #3. Gently pat the area dry. Do not soak the knee in water.   Do not go swimming in the pool or ocean until 4 weeks after surgery or when otherwise instructed.  BRACE/AMBULATION  Your leg will be placed in a brace post-operatively.   You will need to wear your brace at all times until we discuss it further.   You will be instructed on further bracing after your first visit.  Use crutches for comfort but you can put your full weight on the leg as tolerated.  REGIONAL ANESTHESIA (NERVE BLOCKS) - The anesthesia team may have performed a nerve block for you if safe in the setting of your care.  This is a great tool used to minimize pain.  Typically the block may start wearing off overnight.  This can be a challenging period but please utilize your as needed pain medications to try  and manage this period and know it will be a brief transition as the nerve block wears completely   POST-OP MEDICATIONS- Multimodal approach to pain control  In general your pain will be controlled with a combination of substances.  Prescriptions unless otherwise discussed are electronically sent to your pharmacy.  This is a carefully made plan we use to minimize narcotic use.     ? Meloxicam- Anti-inflammatory medication taken on a scheduled basis ? Acetaminophen - Non-narcotic pain medicine taken on a scheduled basis  ? Oxycodone - This is a strong narcotic, to be used only on an as needed basis for pain. ? Aspirin 81mg  - This medicine is used to minimize the risk of blood clots after surgery. ? Zofran - take as needed for nausea  FOLLOW-UP  Please call the office to schedule a follow-up appointment for your incision check if you do not already have one, 7-10 days post-operatively.  IF YOU HAVE ANY QUESTIONS, PLEASE FEEL FREE TO CALL OUR OFFICE.  HELPFUL INFORMATION   If you had a block, it will wear off between 8-24 hrs postop typically.  This is period when your pain may go from nearly zero to the pain you would have had post-op without the block.  This is an abrupt transition but nothing dangerous is happening.  You may  take an extra dose of narcotic when this happens.   Keep your leg elevated to decrease swelling, which will then in turn decrease your pain. I would elevate the foot of your bed by putting a couple of couch pillows between your mattress and box spring. I would not keep pillow directly under your ankle.   You must wear the brace locked while sleeping and ambulating until follow-up.    There will be MORE swelling on days 1-3 than there is on the day of surgery.  This also is normal. The swelling will decrease with the anti-inflammatory medication, ice and keeping it elevated. The swelling will make it more difficult to bend your knee. As the swelling goes down your  motion will become easier   You may develop swelling and bruising that extends from your knee down to your calf and perhaps even to your foot over the next week. Do not be alarmed. This too is normal, and it is due to gravity   There may be some numbness adjacent to the incision site. This may last for 6-12 months or longer in some patients and is expected.   You may return to sedentary work/school in the next couple of days when you feel up to it. You will need to keep your leg elevated as much as possible    You should wean off your narcotic medicines as soon as you are able.  Most patients will be off or using minimal narcotics before their first postop appointment.    We suggest you use the pain medication the first night prior to going to bed, in order to ease any pain when the anesthesia wears off. You should avoid taking pain medications on an empty stomach as it will make you nauseous.   Do not drink alcoholic beverages or take illicit drugs when taking pain medications.   It is against the law to drive while taking narcotics. You cannot drive if your Right leg is in brace locked in extension.   Pain medication may make you constipated.  Below are a few solutions to try in this order: o Decrease the amount of pain medication if you arent having pain. o Drink lots of decaffeinated fluids. o Drink prune juice and/or each dried prunes  o If the first 3 dont work start with additional solutions o Take Colace - an over-the-counter stool softener o Take Senokot - an over-the-counter laxative o Take Miralax - a stronger over-the-counter laxative  For more information including helpful videos and documents visit our website:   [email or web address]     Post Anesthesia Home Care Instructions  Activity: Get plenty of rest for the remainder of the day. A responsible individual must stay with you for 24 hours following the procedure.  For the  next 24 hours, DO NOT: -Drive a car -Advertising copywriter -Drink alcoholic beverages -Take any medication unless instructed by your physician -Make any legal decisions or sign important papers.  Meals: Start with liquid foods such as gelatin or soup. Progress to regular foods as tolerated. Avoid greasy, spicy, heavy foods. If nausea and/or vomiting occur, drink only clear liquids until the nausea and/or vomiting subsides. Call your physician if vomiting continues.  Special Instructions/Symptoms: Your throat may feel dry or sore from the anesthesia or the breathing tube placed in your throat during surgery. If this causes discomfort, gargle with warm salt water. The discomfort should disappear within 24 hours.  If you had a scopolamine patch placed behind your ear for  the management of post- operative nausea and/or vomiting:  1. The medication in the patch is effective for 72 hours, after which it should be removed.  Wrap patch in a tissue and discard in the trash. Wash hands thoroughly with soap and water. 2. You may remove the patch earlier than 72 hours if you experience unpleasant side effects which may include dry mouth, dizziness or visual disturbances. 3. Avoid touching the patch. Wash your hands with soap and water after contact with the patch.

## 2020-06-27 NOTE — Interval H&P Note (Signed)
History and Physical Interval Note:  06/27/2020 10:46 AM  Kelly Ware  has presented today for surgery, with the diagnosis of RIGHT KNEE BURSITIS, RUPTURE OF EXTENSOR TENDONS LOWER LEG.  The various methods of treatment have been discussed with the patient and family. After consideration of risks, benefits and other options for treatment, the patient has consented to  Procedure(s): EXCISION PREPATELLAR BURSA (Right) SUTURE REPAIR INFRAPATELLAR TENDON PRIMARY (Right) as a surgical intervention.  The patient's history has been reviewed, patient examined, no change in status, stable for surgery.  I have reviewed the patient's chart and labs.  Questions were answered to the patient's satisfaction.     Bjorn Pippin

## 2020-06-27 NOTE — Anesthesia Procedure Notes (Signed)
Procedure Name: LMA Insertion Performed by: Bettie Capistran M, CRNA Pre-anesthesia Checklist: Patient identified, Emergency Drugs available, Suction available and Patient being monitored Patient Re-evaluated:Patient Re-evaluated prior to induction Oxygen Delivery Method: Circle system utilized Preoxygenation: Pre-oxygenation with 100% oxygen Induction Type: IV induction Ventilation: Mask ventilation without difficulty LMA: LMA inserted LMA Size: 4.0 Number of attempts: 1 Airway Equipment and Method: Bite block Placement Confirmation: positive ETCO2,  CO2 detector and breath sounds checked- equal and bilateral Tube secured with: Tape Dental Injury: Teeth and Oropharynx as per pre-operative assessment        

## 2020-06-27 NOTE — Anesthesia Preprocedure Evaluation (Signed)
Anesthesia Evaluation  Patient identified by MRN, date of birth, ID band Patient awake    Reviewed: Allergy & Precautions, H&P , NPO status , Patient's Chart, lab work & pertinent test results  Airway Mallampati: II  TM Distance: >3 FB Neck ROM: Full    Dental no notable dental hx.    Pulmonary neg pulmonary ROS, former smoker,    Pulmonary exam normal breath sounds clear to auscultation       Cardiovascular negative cardio ROS Normal cardiovascular exam Rhythm:Regular Rate:Normal     Neuro/Psych negative neurological ROS  negative psych ROS   GI/Hepatic negative GI ROS, Neg liver ROS,   Endo/Other  negative endocrine ROS  Renal/GU negative Renal ROS  negative genitourinary   Musculoskeletal negative musculoskeletal ROS (+)   Abdominal (+) + obese,   Peds negative pediatric ROS (+)  Hematology negative hematology ROS (+)   Anesthesia Other Findings   Reproductive/Obstetrics negative OB ROS                             Anesthesia Physical Anesthesia Plan  ASA: II  Anesthesia Plan: General   Post-op Pain Management:    Induction: Intravenous  PONV Risk Score and Plan: 3 and Ondansetron, Dexamethasone, Midazolam and Treatment may vary due to age or medical condition  Airway Management Planned: LMA  Additional Equipment:   Intra-op Plan:   Post-operative Plan: Extubation in OR  Informed Consent: I have reviewed the patients History and Physical, chart, labs and discussed the procedure including the risks, benefits and alternatives for the proposed anesthesia with the patient or authorized representative who has indicated his/her understanding and acceptance.     Dental advisory given  Plan Discussed with: CRNA  Anesthesia Plan Comments:         Anesthesia Quick Evaluation

## 2020-06-27 NOTE — Op Note (Addendum)
Orthopaedic Surgery Operative Note (CSN: [id number])  Kelly Ware  Jan 12, 1986 Date of Surgery: 06/27/2020   Diagnoses:  Right knee prepatellar mass  Procedure: Right knee prepatellar mass excision   Operative Finding Successful completion of the planned procedure. Patient's lesion was relatively adherent to bone and appeared to involve the periosteum at the distal medial aspect. We marked the specimen with a long suture proximally, a short suture laterally and 2 sutures on the superficial or anterior surface. This was sent for pathology.  The lesion was firmly adherent to the periosteum and had a reasonable vascular supply from the bone. It was adherent but overlaying the patellar tendon as it inserted on the tubercle. We had to resect a few fibers of patellar tendon however the majority of the patellar tendon was well adherent. We think that protecting the patient in a knee immobilizer for a week would be reasonable and at that point we can likely let her start working on motion to tolerance with a therapist and discontinue the brace when she has good extensor mechanism control.  Post-operative plan: The patient will be weightbearing to tolerance in a knee immobilizer discontinuing the brace when she has good extensor mechanism control.  The patient will be discharged home.  DVT prophylaxis Aspirin 81 mg twice daily for 6 weeks.   Pain control with PRN pain medication preferring oral medicines.  Follow up plan will be scheduled in approximately 7 days for incision check and XR.  Post-Op Diagnosis: Same Surgeons:Primary: Bjorn Pippin, MD Assistants:Caroline McBane PA-C Location: [facility] OR ROOM 6 Anesthesia: General with local anesthesia Antibiotics: Ancef 3 g with local vancomycin 1 g at the surgical site Tourniquet time:  Total Tourniquet Time Documented: Thigh (Right) - 16 minutes Total: Thigh (Right) - 16 minutes  Estimated Blood Loss: Minimal Complications: None Specimens: 1 for  permanent pathology. Right knee prepatellar mass. Implants: * No implants in log *  Indications for Surgery:   Kelly Ware is a 35 y.o. female with longstanding mass in the anterior portion of her knee without any malignant signs on x-ray or MRI. Patient wanted to undergo excisional biopsy and thus this was performed at her request since this lesion caused pain. Benefits and risks of operative and nonoperative management were discussed prior to surgery with patient/guardian(s) and informed consent form was completed.  Specific risks including infection, need for additional surgery, need for further oncologic procedures, patellar tendon rupture amongst others.   Procedure:   The patient was identified properly. Informed consent was obtained and the surgical site was marked. The patient was taken up to suite where general anesthesia was induced.  The patient was positioned supine on a regular bed.  The right knee was prepped and draped in the usual sterile fashion.  Timeout was performed before the beginning of the case.  Tourniquet was used for the above duration.  We identified the mass which was about 4 cm x 3 cm in size. Went through skin sharply directly overlying the mass achieving hemostasis we progressed. We raised full-thickness skin flaps and noted that the mass appeared to be below the fascial level. We open the fascia the best we could but were able to find a mobile tissue plane inferior at the distal aspect of the mass. We are able to peel this bluntly away from the underlying bone and periosteum. It did involve the periosteum and one area and this was resected en bloc. As we pulled the lesion away some of the lesion was still  adherent to the patellar tendon and even though we tried to sharply dissect this we still had some residual lesion that was adherent to the tendon. We are able to remove the bulk of the mass at that point with good margins inferiorly and medially and there was some  residual tissue on the anterolateral aspect of the patellar tendon.  We at this point marked the specimen as above in the findings section. It was sent for pathology.  We then used sharp dissection to excisionally debride any obvious residual tissue from the patellar tendon. There was some thickening of the tendon but this did not appear to be related to the lesion itself. There were no signs of infection. We irrigated copiously and released the tourniquet to ensure that we had adequate hemostasis.  The knee was stable easily to 60 degrees of flexion without signs of tension on the residual patellar tendon.  We then were able to put local vancomycin powder at the side of the incision and closed with absorbable suture in a multilayer fashion.  Sterile dressing was placed.  Patient was awoken taken to PACU in stable condition.  Alfonse Alpers, PA-C, present and scrubbed throughout the case, critical for completion in a timely fashion, and for retraction, instrumentation, closure.

## 2020-06-28 ENCOUNTER — Encounter (HOSPITAL_BASED_OUTPATIENT_CLINIC_OR_DEPARTMENT_OTHER): Payer: Self-pay | Admitting: Orthopaedic Surgery

## 2020-06-28 NOTE — Anesthesia Postprocedure Evaluation (Signed)
Anesthesia Post Note  Patient: Kelly Ware  Procedure(s) Performed: EXCISION MASS (Right Knee)     Patient location during evaluation: PACU Anesthesia Type: General Level of consciousness: awake and alert Pain management: pain level controlled Vital Signs Assessment: post-procedure vital signs reviewed and stable Respiratory status: spontaneous breathing, nonlabored ventilation and respiratory function stable Cardiovascular status: blood pressure returned to baseline and stable Postop Assessment: no apparent nausea or vomiting Anesthetic complications: no   No complications documented.  Last Vitals:  Vitals:   06/27/20 1315 06/27/20 1408  BP: (!) 135/94 127/82  Pulse: 72 78  Resp: 11 15  Temp:  36.7 C  SpO2: 100% 97%    Last Pain:  Vitals:   06/27/20 1347  TempSrc:   PainSc: 8                  Lowella Curb

## 2020-07-01 LAB — SURGICAL PATHOLOGY

## 2020-10-28 ENCOUNTER — Ambulatory Visit (HOSPITAL_COMMUNITY)
Admission: EM | Admit: 2020-10-28 | Discharge: 2020-10-28 | Disposition: A | Discharge status: Home / Self Care | Payer: 59 | Attending: Nurse Practitioner | Admitting: Nurse Practitioner

## 2020-10-28 ENCOUNTER — Encounter (HOSPITAL_COMMUNITY): Payer: Self-pay

## 2020-10-28 ENCOUNTER — Other Ambulatory Visit: Payer: Self-pay

## 2020-10-28 DIAGNOSIS — L03213 Periorbital cellulitis: Secondary | ICD-10-CM

## 2020-10-28 DIAGNOSIS — H1031 Unspecified acute conjunctivitis, right eye: Secondary | ICD-10-CM | POA: Diagnosis not present

## 2020-10-28 DIAGNOSIS — H5789 Other specified disorders of eye and adnexa: Secondary | ICD-10-CM | POA: Diagnosis not present

## 2020-10-28 MED ORDER — AMOXICILLIN-POT CLAVULANATE 875-125 MG PO TABS: 1.0000 | ORAL_TABLET | Freq: Two times a day (BID) | ORAL | 0 refills | Status: DC

## 2020-10-28 MED ORDER — POLYMYXIN B-TRIMETHOPRIM 10000-0.1 UNIT/ML-% OP SOLN: 1.0000 [drp] | OPHTHALMIC | 0 refills | Status: AC

## 2020-10-28 MED ORDER — IBUPROFEN 600 MG PO TABS: 600.0000 mg | ORAL_TABLET | Freq: Four times a day (QID) | ORAL | 0 refills | Status: DC | PRN

## 2020-10-28 NOTE — Discharge Instructions (Addendum)
Preseptal cellulitis is an infection of the eyelid and the tissues around the eye. Symptoms of preseptal cellulitis usually develop suddenly and include red and swollen eyelids, fever, difficulty opening the eye, headache, and facial pain. This condition is treated with antibiotic medicines. Do not stop taking the antibiotic even if you start to feel better. Wash your hands with soap and water often for at least 20 seconds. If soap and water are not available, use hand sanitizer. Symptoms should improve in 48 hours. If not, please follow-up

## 2020-10-28 NOTE — ED Triage Notes (Signed)
Pt noticed some mild pain in R eye 3 days ago.  Had drainage and increased pain upon awakening today.  Significant swelling noted.  Some itching.

## 2020-10-28 NOTE — ED Provider Notes (Signed)
[facility]    CSN: [id number] Arrival date & time: 10/28/20  1019      History   Chief Complaint Chief Complaint  Patient presents with   Eye Problem    R    HPI Kelly Ware is a 35 y.o. female.   Subjective:  Kelly Ware is a 35 y.o. female who presents for evaluation of redness and swelling of the right eye. She has noticed the above symptoms in the right eye for 3 days. Onset was gradual and started off with irritation. Symptoms have included discharge, light sensitivity, erythema, pain, and tearing. Patient denies foreign body sensation, itching, or visual field deficit. No fevers, congestion, sneezing, runny nose, myalgias, neck pain/stiffness, headache or trauma to the eye noted. There is a history of contact lens use. Patient removed the contact from that eye on Friday which is the day symptoms began.   The following portions of the patient's history were reviewed and updated as appropriate: allergies, current medications, past family history, past medical history, past social history, past surgical history, and problem list.    Treat for acute preseptal cellulitis as well as for conjunctivitis.  Past Medical History:  Diagnosis Date   Anemia    History of COVID-19 06/01/2020    Patient Active Problem List   Diagnosis Date Noted   Left shoulder strain 08/11/2013   Strain of lumbar region 03/12/2011    Past Surgical History:  Procedure Laterality Date   MASS EXCISION Right 06/27/2020   Procedure: EXCISION MASS;  Surgeon: Bjorn Pippin, MD;  Location: [facility];  Service: Orthopedics;  Laterality: Right;   NO PAST SURGERIES      OB History   No obstetric history on file.      Home Medications    Prior to Admission medications   Medication Sig Start Date End Date Taking? Authorizing Provider  amoxicillin-clavulanate (AUGMENTIN) 875-125 MG tablet Take 1 tablet by mouth every 12 (twelve) hours. 10/28/20  Yes Lurline Idol, FNP  ibuprofen (ADVIL) 600 MG tablet Take 1 tablet (600 mg total) by mouth every 6 (six) hours as needed. 10/28/20  Yes Lurline Idol, FNP  trimethoprim-polymyxin b (POLYTRIM) ophthalmic solution Place 1 drop into the right eye every 4 (four) hours for 7 days. 10/28/20 11/04/20 Yes Lurline Idol, FNP    Family History Family History  Problem Relation Age of Onset   Heart disease Maternal Grandfather     Social History Social History   Tobacco Use   Smoking status: Former    Pack years: 0.00    Types: Cigarettes    Quit date: 05/18/2013    Years since quitting: 7.4   Smokeless tobacco: Never   Tobacco comments:    smokes some times  Vaping Use   Vaping Use: Never used  Substance Use Topics   Alcohol use: No    Comment: occas   Drug use: No     Allergies   Patient has no known allergies.   Review of Systems Review of Systems  Constitutional:  Negative for fever.  HENT:  Negative for congestion, rhinorrhea and sneezing.   Eyes:  Positive for photophobia, pain, discharge, redness and visual disturbance. Negative for itching.  Musculoskeletal:  Negative for myalgias, neck pain and neck stiffness.  Neurological:  Negative for headaches.  All other systems reviewed and are negative.   Physical Exam Triage Vital Signs ED Triage Vitals  Enc Vitals Group     BP 10/28/20 1043 130/82  Pulse Rate 10/28/20 1043 92     Resp 10/28/20 1043 18     Temp 10/28/20 1043 98.8 F (37.1 C)     Temp Source 10/28/20 1043 Oral     SpO2 10/28/20 1043 99 %     Weight --      Height --      Head Circumference --      Peak Flow --      Pain Score 10/28/20 1042 10     Pain Loc --      Pain Edu? --      Excl. in GC? --    No data found.  Updated Vital Signs BP 130/82 (BP Location: Right Arm)   Pulse 92   Temp 98.8 F (37.1 C) (Oral)   Resp 18   LMP 09/26/2020 (Approximate)   SpO2 99%   Visual Acuity Right Eye Distance: 20/200 Left Eye Distance: 20/13  (corrected ) Bilateral Distance: 20/13 (contact in L eye )  Right Eye Near:   Left Eye Near:    Bilateral Near:     Physical Exam Vitals reviewed.  Constitutional:      Appearance: Normal appearance.  HENT:     Head: Normocephalic.     Right Ear: Tympanic membrane normal.     Left Ear: Tympanic membrane normal.     Nose: Nose normal.     Mouth/Throat:     Mouth: Mucous membranes are moist.  Eyes:     General: Vision grossly intact. No visual field deficit.    Extraocular Movements: Extraocular movements intact.     Conjunctiva/sclera:     Right eye: Right conjunctiva is injected.     Pupils: Pupils are equal, round, and reactive to light.     Visual Fields: Right eye visual fields normal and left eye visual fields normal.     Comments: Right upper lid edema   Cardiovascular:     Rate and Rhythm: Normal rate and regular rhythm.  Pulmonary:     Effort: Pulmonary effort is normal.     Breath sounds: Normal breath sounds.  Musculoskeletal:        General: Normal range of motion.     Cervical back: Normal range of motion and neck supple. No rigidity.  Lymphadenopathy:     Cervical: No cervical adenopathy.  Skin:    General: Skin is warm and dry.  Neurological:     General: No focal deficit present.     Mental Status: She is alert and oriented to person, place, and time.  Psychiatric:        Mood and Affect: Mood normal.        Behavior: Behavior normal.     UC Treatments / Results  Labs (all labs ordered are listed, but only abnormal results are displayed) Labs Reviewed - No data to display  EKG   Radiology No results found.  Procedures Procedures (including critical care time)  Medications Ordered in UC Medications - No data to display  Initial Impression / Assessment and Plan / UC Course  I have reviewed the triage vital signs and the nursing notes.  Pertinent labs & imaging results that were available during my care of the patient were reviewed by me  and considered in my medical decision making (see chart for details).     36 year old female presenting with a 3-day history of right eye redness, swelling, tearing, light sensitivity and pain.  Symptom onset was gradual and started off with mild eye irritation.  She removed the contact lens from that eye on that day.  Symptoms worsened over the past couple of days.  No foreign body sensation visual field deficit.  Afebrile.  Physical exam right upper lid edema.  No pain with extraocular movements.  Right conjunctiva injection noted.  Visual fields normal.  PERRLA.  Will treat for preseptal cellulitis and conjunctivitis. Discussed the diagnosis, treatment, monitoring parameters and indications for follow-up with patient. 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 Ophthalmic drops per orders. Antibiotics per orders. Warm compress to eye(s). Local eye care discussed. Analgesics as needed.  Today's evaluation has revealed no signs of a dangerous process. Discussed diagnosis with patient and/or guardian. Patient and/or guardian aware of their diagnosis, possible red flag symptoms to watch out for and need for close follow up. Patient and/or guardian understands verbal and written discharge instructions. Patient and/or guardian comfortable with plan and disposition.  Patient and/or guardian has a clear mental status at this time, good insight into illness (after discussion and teaching) and has clear judgment to make decisions regarding their care  This care was provided during an unprecedented National Emergency due to the Novel Coronavirus (COVID-19) pandemic. COVID-19 infections and transmission risks place heavy strains on healthcare resources.  As this pandemic evolves, our facility, providers, and staff strive to respond fluidly, to remain operational, and to provide care relative to available resources and information. Outcomes are unpredictable and treatments are without well-defined guidelines. Further, the  impact of COVID-19 on all aspects of urgent care, including the impact to patients seeking care for reasons other than COVID-19, is unavoidable during this national emergency. At this time of the global pandemic, management of patients has significantly changed, even for non-COVID positive patients given high local and regional COVID volumes at this time requiring high healthcare system and resource utilization. The standard of care for management of both COVID suspected and non-COVID suspected patients continues to change rapidly at the local, regional, national, and global levels. This patient was worked up and treated to the best available but ever changing evidence and resources available at this current time.   Documentation was completed with the aid of voice recognition software. Transcription may contain typographical errors. Final Clinical Impressions(s) / UC Diagnoses   Final diagnoses:  Acute conjunctivitis of right eye, unspecified acute conjunctivitis type  Eye irritation  Preseptal cellulitis of right upper eyelid     Discharge Instructions      Preseptal cellulitis is an infection of the eyelid and the tissues around the eye. Symptoms of preseptal cellulitis usually develop suddenly and include red and swollen eyelids, fever, difficulty opening the eye, headache, and facial pain. This condition is treated with antibiotic medicines. Do not stop taking the antibiotic even if you start to feel better. Wash your hands with soap and water often for at least 20 seconds. If soap and water are not available, use hand sanitizer. Symptoms should improve in 48 hours. If not, please follow-up      ED Prescriptions     Medication Sig Dispense Auth. Provider   amoxicillin-clavulanate (AUGMENTIN) 875-125 MG tablet Take 1 tablet by mouth every 12 (twelve) hours. 14 tablet Wrangler Penning, [place], FNP   trimethoprim-polymyxin b (POLYTRIM) ophthalmic solution Place 1 drop into the right eye every  4 (four) hours for 7 days. 10 mL Lurline Idol, FNP   ibuprofen (ADVIL) 600 MG tablet Take 1 tablet (600 mg total) by mouth every 6 (six) hours as needed. 30 tablet Lurline Idol, [place]  PDMP not reviewed this encounter.   Lurline IdolMurrill, Abrar Koone, [place]NP 10/28/20 1217

## 2022-05-07 ENCOUNTER — Ambulatory Visit (INDEPENDENT_AMBULATORY_CARE_PROVIDER_SITE_OTHER): Payer: Managed Care, Other (non HMO) | Admitting: Student

## 2022-05-07 ENCOUNTER — Encounter: Payer: Self-pay | Admitting: Student

## 2022-05-07 VITALS — BP 120/70 | HR 87 | Ht 71.0 in | Wt 288.2 lb

## 2022-05-07 DIAGNOSIS — Z6841 Body Mass Index (BMI) 40.0 and over, adult: Secondary | ICD-10-CM

## 2022-05-07 DIAGNOSIS — Z1159 Encounter for screening for other viral diseases: Secondary | ICD-10-CM

## 2022-05-07 DIAGNOSIS — Z130 Encounter for screening for diseases of the blood and blood-forming organs and certain disorders involving the immune mechanism: Secondary | ICD-10-CM | POA: Diagnosis not present

## 2022-05-07 DIAGNOSIS — Z13220 Encounter for screening for lipoid disorders: Secondary | ICD-10-CM

## 2022-05-07 DIAGNOSIS — E669 Obesity, unspecified: Secondary | ICD-10-CM | POA: Insufficient documentation

## 2022-05-07 DIAGNOSIS — Z131 Encounter for screening for diabetes mellitus: Secondary | ICD-10-CM

## 2022-05-07 DIAGNOSIS — N946 Dysmenorrhea, unspecified: Secondary | ICD-10-CM | POA: Insufficient documentation

## 2022-05-07 DIAGNOSIS — Z114 Encounter for screening for human immunodeficiency virus [HIV]: Secondary | ICD-10-CM

## 2022-05-07 DIAGNOSIS — E66813 Obesity, class 3: Secondary | ICD-10-CM

## 2022-05-07 DIAGNOSIS — N926 Irregular menstruation, unspecified: Secondary | ICD-10-CM

## 2022-05-07 MED ORDER — NORGESTIMATE-ETH ESTRADIOL 0.25-35 MG-MCG PO TABS: 1.0000 | ORAL_TABLET | Freq: Every day | ORAL | 11 refills | Status: DC

## 2022-05-07 NOTE — Assessment & Plan Note (Signed)
New onset AUB since October.  Not very painful.  Use of Depo in the past aided in painful menses. Consider endometriosis, ovulatory dysfunction, polyp or adenomyosis, infection.  No concern for pregnancy as she only has female partner. She is hemodynamically stable and well-appearing. -Obtain CT BC, CMP, prolactin level today. -Start Sprintec combined OCP today -Would expect that it may take up to 3 months for her to have regulation of menses, although plan to see her in 1 month for follow-up and see how she is doing. -If continued dysregulation/pain, can further workup with pelvic ultrasound or other labs. -Can obtain Pap and other STI testing at visit in January

## 2022-05-07 NOTE — Patient Instructions (Addendum)
It was great seeing you today.  I prescribed Sprintec for you to take daily to help regulate your periods.  I collected labs today- I will call you with the results if abnormal.  See dentist info below: Dr. Marcina Millard Chabot [phone]   If you have any questions or concerns, please feel free to call the clinic.   Schedule an appointment in 1 month so I can see how you are doing.  Happy Holidays!!  Dr. Darral Dash [facility] [phone]

## 2022-05-07 NOTE — Progress Notes (Signed)
    SUBJECTIVE:   CHIEF COMPLAINT / HPI:   Kelly Ware is a very pleasant 36 year old female here to establish care and discuss irregular menses.  Social history: She works for [employer], from home. Uses she/her/hers pronouns, interested in women only, married  Medical conditions: None Medications: None Allergies: NKDA Surgeries: Excision prepatellar bursa in 2022 Family hx: Grandma w/ glaucoma. Maternal grandfather w/ ICD. T2DM. Mom- CHF. Denies any cancers.  Smoking/Vaping:  No Alcohol: No Marijuana: Occasionally Illicit substances: None COVID: Yes  Concern today: Irregular menses She was 12 when she had first period. Took Depo shots for painful menses in high school which helped regulate it. Period "coming off and on"  LMP in October 10/20-25, stop for 2-3 days and come back.This period was a little bit sore in her lower abdomen.  But she denies any heavy bleeding or extreme pain. Uses pads and going through 2-3 pads a day.  PERTINENT  PMH / PSH: See above  OBJECTIVE:   BP 120/70   Pulse 87   Ht 5\' 11"  (1.803 m)   Wt 288 lb 4 oz (130.7 kg)   LMP 04/16/2022   SpO2 98%   BMI 40.20 kg/m  General: Well-appearing, no distress CV: Regular rate and rhythm Respiratory: Normal work of breathing on room air Abdomen: Soft, nontender and nondistended Extremities: Warm, well perfused Neuro: Speech clear and fluent.  No focal deficits Psych: Pleasant.  Normal mood and affect   ASSESSMENT/PLAN:   Obesity Obtain lipid panel and A1c today.  Irregular menses New onset AUB since October.  Not very painful.  Use of Depo in the past aided in painful menses. Consider endometriosis, ovulatory dysfunction, polyp or adenomyosis, infection.  No concern for pregnancy as she only has female partner. She is hemodynamically stable and well-appearing. -Obtain CT BC, CMP, prolactin level today. -Start Sprintec combined OCP today -Would expect that it may take up to 3 months for  her to have regulation of menses, although plan to see her in 1 month for follow-up and see how she is doing. -If continued dysregulation/pain, can further workup with pelvic ultrasound or other labs. -Can obtain Pap and other STI testing at visit in January     February, DO [facility]

## 2022-05-07 NOTE — Assessment & Plan Note (Signed)
Obtain lipid panel and A1c today.

## 2022-05-08 LAB — COMPREHENSIVE METABOLIC PANEL
ALT: 13 IU/L (ref 0–32)
AST: 14 IU/L (ref 0–40)
Albumin/Globulin Ratio: 1.2 (ref 1.2–2.2)
Albumin: 4.1 g/dL (ref 3.9–4.9)
Alkaline Phosphatase: 139 IU/L — ABNORMAL HIGH (ref 44–121)
BUN/Creatinine Ratio: 9 (ref 9–23)
BUN: 8 mg/dL (ref 6–20)
Bilirubin Total: 0.2 mg/dL (ref 0.0–1.2)
CO2: 22 mmol/L (ref 20–29)
Calcium: 9.5 mg/dL (ref 8.7–10.2)
Chloride: 102 mmol/L (ref 96–106)
Creatinine, Ser: 0.87 mg/dL (ref 0.57–1.00)
Globulin, Total: 3.3 g/dL (ref 1.5–4.5)
Glucose: 89 mg/dL (ref 70–99)
Potassium: 4.2 mmol/L (ref 3.5–5.2)
Sodium: 140 mmol/L (ref 134–144)
Total Protein: 7.4 g/dL (ref 6.0–8.5)
eGFR: 88 mL/min/{1.73_m2} (ref >59)

## 2022-05-08 LAB — LIPID PANEL
Chol/HDL Ratio: 2.3 ratio (ref 0.0–4.4)
Cholesterol, Total: 110 mg/dL (ref 100–199)
HDL: 47 mg/dL (ref >39)
LDL Chol Calc (NIH): 48 mg/dL (ref 0–99)
Triglycerides: 74 mg/dL (ref 0–149)
VLDL Cholesterol Cal: 15 mg/dL (ref 5–40)

## 2022-05-08 LAB — CBC
Hematocrit: 26.2 % — ABNORMAL LOW (ref 34.0–46.6)
Hemoglobin: 7.3 g/dL — ABNORMAL LOW (ref 11.1–15.9)
MCH: 17.5 pg — ABNORMAL LOW (ref 26.6–33.0)
MCHC: 27.9 g/dL — ABNORMAL LOW (ref 31.5–35.7)
MCV: 63 fL — ABNORMAL LOW (ref 79–97)
Platelets: 408 10*3/uL (ref 150–450)
RBC: 4.17 x10E6/uL (ref 3.77–5.28)
RDW: 21.2 % — ABNORMAL HIGH (ref 11.7–15.4)
WBC: 7.3 10*3/uL (ref 3.4–10.8)

## 2022-05-08 LAB — HEMOGLOBIN A1C
Est. average glucose Bld gHb Est-mCnc: 123 mg/dL
Hgb A1c MFr Bld: 5.9 % — ABNORMAL HIGH (ref 4.8–5.6)

## 2022-05-08 LAB — PROLACTIN: Prolactin: 18.5 ng/mL (ref 4.8–33.4)

## 2022-05-11 ENCOUNTER — Other Ambulatory Visit: Payer: Self-pay | Admitting: Student

## 2022-05-11 MED ORDER — FERROUS SULFATE 325 (65 FE) MG PO TABS: 325.0000 mg | ORAL_TABLET | ORAL | 3 refills | Status: AC

## 2022-07-07 ENCOUNTER — Other Ambulatory Visit: Payer: Self-pay

## 2022-07-08 ENCOUNTER — Other Ambulatory Visit: Payer: Self-pay

## 2022-07-08 MED ORDER — NORGESTIMATE-ETH ESTRADIOL 0.25-35 MG-MCG PO TABS: 1.0000 | ORAL_TABLET | Freq: Every day | ORAL | 3 refills | Status: DC

## 2022-07-08 NOTE — Telephone Encounter (Signed)
Patient calls nurse line regarding [place]. She reports that insurance is no longer covering prescription at [facility] and that it needs to be sent to [facility].   I have called and canceled remaining refills at [facility].   Patient will need 90 day supply sent to [facility].   This has been pended to this encounter.   Please send to [facility]. Thanks.   Talbot Grumbling, RN

## 2023-05-18 ENCOUNTER — Other Ambulatory Visit: Payer: Self-pay | Admitting: Student

## 2023-06-03 MED ORDER — NORGESTIMATE-ETH ESTRADIOL 0.25-35 MG-MCG PO TABS: 1.0000 | ORAL_TABLET | Freq: Every day | ORAL | 3 refills | Status: AC

## 2023-06-03 NOTE — Telephone Encounter (Signed)
Patient calls nurse line regarding birth control pills. Per chart review, medication was set to print. Resent electronically.   Veronda Prude, RN

## 2023-06-03 NOTE — Addendum Note (Signed)
Addended by: Veronda Prude on: 06/03/2023 01:40 PM   Modules accepted: Orders

## 2023-07-07 ENCOUNTER — Ambulatory Visit: Payer: Managed Care, Other (non HMO) | Admitting: Student

## 2023-07-15 ENCOUNTER — Encounter: Payer: Self-pay | Admitting: Student

## 2023-07-15 ENCOUNTER — Ambulatory Visit (INDEPENDENT_AMBULATORY_CARE_PROVIDER_SITE_OTHER): Payer: Managed Care, Other (non HMO) | Admitting: Student

## 2023-07-15 VITALS — BP 123/70 | HR 86 | Ht 70.0 in | Wt 292.6 lb

## 2023-07-15 DIAGNOSIS — Z6841 Body Mass Index (BMI) 40.0 and over, adult: Secondary | ICD-10-CM

## 2023-07-15 DIAGNOSIS — D649 Anemia, unspecified: Secondary | ICD-10-CM | POA: Diagnosis not present

## 2023-07-15 DIAGNOSIS — E66813 Obesity, class 3: Secondary | ICD-10-CM

## 2023-07-15 DIAGNOSIS — R7303 Prediabetes: Secondary | ICD-10-CM

## 2023-07-15 LAB — POCT GLYCOSYLATED HEMOGLOBIN (HGB A1C): Hemoglobin A1C: 5.9 % — AB (ref 4.0–5.6)

## 2023-07-15 MED ORDER — MOUNJARO 2.5 MG/0.5ML ~~LOC~~ SOAJ: 2.5000 mg | SUBCUTANEOUS | 3 refills | Status: AC

## 2023-07-15 NOTE — Patient Instructions (Addendum)
 It was great seeing you today.  As we discussed, - Try using Magnesium Oxide (sold at [facility]) - I sent in [place]- will await insurance auth. We also discussed Phentermine and Bupropion (Wellbutrin).  I will be in touch with you!   If you have any questions or concerns, please feel free to call the clinic.   Have a wonderful day,  Dr. Darral Dash [facility] [phone]

## 2023-07-15 NOTE — Progress Notes (Addendum)
    SUBJECTIVE:   Chief compliant/HPI: Weight management  Kelly Ware is a 38 y.o. who presents today to discuss weight management.  Lowest weight at 20 Started gaining weight in 20s. Highest weight is 292. Likes to play basketball, but became less active. She works from home She likes to use the gym at her apartment for exercise, 3 times per week  She eats mostly at night time. Cognitively making choices to eat higher protein foods.  Pain closer attention to calories. Craves pasta, pizza. Eats out at restaurants 3x per week.  Stress levels: Some worse than others. Some financial stress. Always has enough money for food. Wife is doing well- planning on having a baby.  OBJECTIVE:   BP 123/70   Pulse 86   Ht 5\' 10"  (1.778 m)   Wt 232 lb 9.6 oz (105.5 kg)   SpO2 100%   BMI 33.37 kg/m   General: NAD, well appearing Cardiac: RRR Neuro: A&O Respiratory: normal WOB on RA. No wheezing or crackles on auscultation, good lung sounds throughout Extremities: Moving all 4 extremities equally   ASSESSMENT/PLAN:   Assessment & Plan Class 3 severe obesity with body mass index (BMI) of 40.0 to 44.9 in adult, unspecified obesity type, unspecified whether serious comorbidity present [facility]) Patient is actively exercising at the home gym in her [place] 3 times per week for 30 minutes at a time, and tracking her calories through an app while making smarter food choices. Would greatly benefit from GLP-1 especially given prediabetes comorbidity, with BMI 41%. Tirzepatide 2.5 mg sent to pharmacy.  Anemia, unspecified type CBC today. Continue ferrous sulfate 325 mg daily. Prediabetes A1c 5.9%.    Darral Dash, DO [facility]

## 2023-07-16 ENCOUNTER — Encounter: Payer: Self-pay | Admitting: Student

## 2023-07-16 LAB — COMPREHENSIVE METABOLIC PANEL
ALT: 10 [IU]/L (ref 0–32)
AST: 14 [IU]/L (ref 0–40)
Albumin: 4 g/dL (ref 3.9–4.9)
Alkaline Phosphatase: 132 [IU]/L — ABNORMAL HIGH (ref 44–121)
BUN/Creatinine Ratio: 8 — ABNORMAL LOW (ref 9–23)
BUN: 6 mg/dL (ref 6–20)
Bilirubin Total: <0.2 mg/dL (ref 0.0–1.2)
CO2: 24 mmol/L (ref 20–29)
Calcium: 9.2 mg/dL (ref 8.7–10.2)
Chloride: 103 mmol/L (ref 96–106)
Creatinine, Ser: 0.79 mg/dL (ref 0.57–1.00)
Globulin, Total: 3 g/dL (ref 1.5–4.5)
Glucose: 81 mg/dL (ref 70–99)
Potassium: 4.3 mmol/L (ref 3.5–5.2)
Sodium: 139 mmol/L (ref 134–144)
Total Protein: 7 g/dL (ref 6.0–8.5)
eGFR: 98 mL/min/{1.73_m2} (ref >59)

## 2023-07-16 LAB — CBC
Hematocrit: 34.4 % (ref 34.0–46.6)
Hemoglobin: 11 g/dL — ABNORMAL LOW (ref 11.1–15.9)
MCH: 22.9 pg — ABNORMAL LOW (ref 26.6–33.0)
MCHC: 32 g/dL (ref 31.5–35.7)
MCV: 72 fL — ABNORMAL LOW (ref 79–97)
Platelets: 347 10*3/uL (ref 150–450)
RBC: 4.8 x10E6/uL (ref 3.77–5.28)
RDW: 19.2 % — ABNORMAL HIGH (ref 11.7–15.4)
WBC: 8.5 10*3/uL (ref 3.4–10.8)

## 2023-07-16 LAB — TSH RFX ON ABNORMAL TO FREE T4: TSH: 1.84 u[IU]/mL (ref 0.450–4.500)

## 2023-07-16 NOTE — Assessment & Plan Note (Signed)
 Patient is actively exercising at the home gym in her [place] 3 times per week for 30 minutes at a time, and tracking her calories through an app while making smarter food choices. Would greatly benefit from GLP-1 especially given prediabetes comorbidity, with BMI 41%. Tirzepatide 2.5 mg sent to pharmacy.

## 2023-07-19 ENCOUNTER — Telehealth: Payer: Self-pay

## 2023-07-19 ENCOUNTER — Other Ambulatory Visit (HOSPITAL_COMMUNITY): Payer: Self-pay

## 2023-07-19 NOTE — Telephone Encounter (Signed)
 Pharmacy Patient Advocate Encounter   Received notification from CoverMyMeds that prior authorization for [facility] is required/requested.   Insurance verification completed.   The patient is insured through [employer] .   PA required; PA submitted to above mentioned insurance via CoverMyMeds Key/confirmation #/EOC [facility]. Status is pending

## 2023-07-21 ENCOUNTER — Other Ambulatory Visit: Payer: Self-pay | Admitting: Medical Genetics

## 2023-07-26 NOTE — Telephone Encounter (Signed)
 Pharmacy Patient Advocate Encounter  Received notification from [facility] that Prior Authorization for [name] 2.5MG  has been DENIED.  Full denial letter will be uploaded to the media tab. See denial reason below.  We are unable to approve this request for the following reason(s):  Coverage is provided for the diagnosis of type 2 diabetes mellitus. Coverage cannot be authorized at this time.   PA #/Case ID/Reference #: [id number]

## 2023-10-26 ENCOUNTER — Encounter: Payer: Self-pay | Admitting: *Deleted

## 2023-12-04 ENCOUNTER — Ambulatory Visit: Admission: EM | Admit: 2023-12-04 | Discharge: 2023-12-04 | Disposition: A | Discharge status: Home / Self Care

## 2023-12-04 DIAGNOSIS — J028 Acute pharyngitis due to other specified organisms: Secondary | ICD-10-CM

## 2023-12-04 LAB — POCT RAPID STREP A (OFFICE): Rapid Strep A Screen: NEGATIVE

## 2023-12-04 MED ORDER — PREDNISONE 20 MG PO TABS: 40.0000 mg | ORAL_TABLET | Freq: Every day | ORAL | 0 refills | Status: AC

## 2023-12-04 MED ORDER — AZELASTINE HCL 0.1 % NA SOLN: 1.0000 | Freq: Two times a day (BID) | NASAL | 1 refills | Status: AC

## 2023-12-04 NOTE — Discharge Instructions (Addendum)
  1. Acute pharyngitis due to other specified organisms (Primary) - POCT rapid strep A performed in UC is negative for strep pharyngitis. - predniSONE  (DELTASONE ) 20 MG tablet; Take 2 tablets (40 mg total) by mouth daily for 5 days.  Dispense: 10 tablet; Refill: 0 - azelastine  (ASTELIN ) 0.1 % nasal spray; Place 1 spray into both nostrils 2 (two) times daily. Use in each nostril as directed  Dispense: 30 mL; Refill: 1 -Continue to monitor symptoms for any change in severity if there is any escalation of current symptoms or development of new symptoms follow-up in ER for further evaluation and management.

## 2023-12-04 NOTE — ED Triage Notes (Signed)
 Last week started with sore throat like I was swallowing glass, not as bad now but really bad at night and in the morning. No fever (known). No runny nose or cough. No new/unexplained rash.

## 2023-12-04 NOTE — ED Provider Notes (Signed)
 [facility]  Note:  This document was prepared using Conservation officer, historic buildings and may include unintentional dictation errors.  MRN: [id number] DOB: 02/15/1986  Subjective:   Kenia Teagarden is a 38 y.o. female presenting for persistent sore throat x 2 weeks.  Patient denies any known sick contacts or exposure to strep.  Patient believes that symptoms would subside on their own but have not gone away.  Patient reports history of nasal allergy symptoms but has not taken any over-the-counter medication or treat symptoms.  Patient concern for possible strep and would like testing to make sure that is not the cause of symptoms.  No current facility-administered medications for this encounter.  Current Outpatient Medications:    azelastine  (ASTELIN ) 0.1 % nasal spray, Place 1 spray into both nostrils 2 (two) times daily. Use in each nostril as directed, Disp: 30 mL, Rfl: 1   indomethacin (INDOCIN) 25 MG capsule, Take 25 mg by mouth 2 (two) times daily., Disp: , Rfl:    norgestimate -ethinyl estradiol  (ESTARYLLA) 0.25-35 MG-MCG tablet, Take 1 tablet by mouth daily., Disp: 84 tablet, Rfl: 3   predniSONE  (DELTASONE ) 20 MG tablet, Take 2 tablets (40 mg total) by mouth daily for 5 days., Disp: 10 tablet, Rfl: 0   ferrous sulfate  325 (65 FE) MG tablet, Take 1 tablet (325 mg total) by mouth every other day., Disp: 90 tablet, Rfl: 3   tirzepatide  (MOUNJARO ) 2.5 MG/0.5ML Pen, Inject 2.5 mg into the skin once a week., Disp: 0.5 mL, Rfl: 3   No Known Allergies  Past Medical History:  Diagnosis Date   Anemia    History of COVID-19 06/01/2020     Past Surgical History:  Procedure Laterality Date   MASS EXCISION Right 06/27/2020   Procedure: EXCISION MASS;  Surgeon: Cristy Bonner DASEN, MD;  Location: [facility];  Service: Orthopedics;  Laterality: Right;   NO PAST SURGERIES      Family History  Problem Relation Age of Onset   Heart disease Maternal Grandfather      Social History   Tobacco Use   Smoking status: Former    Current packs/day: 0.00    Types: Cigarettes    Quit date: 05/18/2013    Years since quitting: 10.5   Smokeless tobacco: Never   Tobacco comments:    smokes some times  Vaping Use   Vaping status: Never Used  Substance Use Topics   Alcohol use: No    Comment: occas   Drug use: No    ROS Refer to HPI for ROS details.  Objective:   Vitals: BP 122/61 (BP Location: Left Arm)   Pulse 76   Temp 98.8 F (37.1 C) (Oral)   Resp 20   Ht 5' 10 (1.778 m)   Wt 260 lb (117.9 kg)   LMP 11/27/2023 (Approximate)   SpO2 98%   BMI 37.31 kg/m   Physical Exam Vitals and nursing note reviewed.  Constitutional:      General: She is not in acute distress.    Appearance: She is well-developed. She is not ill-appearing or toxic-appearing.  HENT:     Head: Normocephalic and atraumatic.     Nose: Congestion present. No rhinorrhea.     Mouth/Throat:     Mouth: Mucous membranes are moist.     Pharynx: Oropharynx is clear. Posterior oropharyngeal erythema present. No oropharyngeal exudate.  Eyes:     General:        Right eye: No discharge.  Left eye: No discharge.     Extraocular Movements: Extraocular movements intact.     Conjunctiva/sclera: Conjunctivae normal.  Cardiovascular:     Rate and Rhythm: Normal rate.  Pulmonary:     Effort: Pulmonary effort is normal. No respiratory distress.  Skin:    General: Skin is warm and dry.  Neurological:     General: No focal deficit present.     Mental Status: She is alert and oriented to person, place, and time.  Psychiatric:        Mood and Affect: Mood normal.        Behavior: Behavior normal.     Procedures  Results for orders placed or performed during the hospital encounter of 12/04/23 (from the past 24 hours)  POCT rapid strep A     Status: Normal   Collection Time: 12/04/23  3:03 PM  Result Value Ref Range   Rapid Strep A Screen Negative Negative    No  results found.   Assessment and Plan :     Discharge Instructions       1. Acute pharyngitis due to other specified organisms (Primary) - POCT rapid strep A performed in UC is negative for strep pharyngitis. - predniSONE  (DELTASONE ) 20 MG tablet; Take 2 tablets (40 mg total) by mouth daily for 5 days.  Dispense: 10 tablet; Refill: 0 - azelastine  (ASTELIN ) 0.1 % nasal spray; Place 1 spray into both nostrils 2 (two) times daily. Use in each nostril as directed  Dispense: 30 mL; Refill: 1 -Continue to monitor symptoms for any change in severity if there is any escalation of current symptoms or development of new symptoms follow-up in ER for further evaluation and management.       Dalisha Shively B Zvi Duplantis   Marishka Rentfrow, [place] B, [place] 12/04/23 1521

## 2024-03-01 ENCOUNTER — Other Ambulatory Visit: Payer: Self-pay | Admitting: Medical Genetics

## 2024-03-01 DIAGNOSIS — Z006 Encounter for examination for normal comparison and control in clinical research program: Secondary | ICD-10-CM
